# Patient Record
Sex: Female | Born: 1970 | Race: Asian | Hispanic: No | Marital: Single | Smoking: Former smoker
Health system: Southern US, Community
[De-identification: ages and names within clinical notes are randomized; demographics above are authoritative.]

## PROBLEM LIST (undated history)

## (undated) DIAGNOSIS — J302 Other seasonal allergic rhinitis: Secondary | ICD-10-CM

## (undated) DIAGNOSIS — J039 Acute tonsillitis, unspecified: Secondary | ICD-10-CM

## (undated) DIAGNOSIS — R591 Generalized enlarged lymph nodes: Secondary | ICD-10-CM

## (undated) HISTORY — DX: Acute tonsillitis, unspecified: J03.90

## (undated) HISTORY — DX: Generalized enlarged lymph nodes: R59.1

## (undated) HISTORY — DX: Other seasonal allergic rhinitis: J30.2

## (undated) HISTORY — PX: OTHER SURGICAL HISTORY: SHX169

---

## 2005-04-05 ENCOUNTER — Other Ambulatory Visit: Admission: RE | Admit: 2005-04-05 | Discharge: 2005-04-05 | Payer: Self-pay | Admitting: Family Medicine

## 2005-04-20 ENCOUNTER — Encounter: Admission: RE | Admit: 2005-04-20 | Discharge: 2005-04-20 | Payer: Self-pay | Admitting: Family Medicine

## 2006-07-18 ENCOUNTER — Other Ambulatory Visit: Admission: RE | Admit: 2006-07-18 | Discharge: 2006-07-18 | Payer: Self-pay | Admitting: Family Medicine

## 2009-11-25 ENCOUNTER — Other Ambulatory Visit: Admission: RE | Admit: 2009-11-25 | Discharge: 2009-11-25 | Payer: Self-pay | Admitting: Family Medicine

## 2010-06-03 ENCOUNTER — Emergency Department (INDEPENDENT_AMBULATORY_CARE_PROVIDER_SITE_OTHER): Payer: BC Managed Care – PPO

## 2010-06-03 ENCOUNTER — Emergency Department (HOSPITAL_BASED_OUTPATIENT_CLINIC_OR_DEPARTMENT_OTHER)
Admission: EM | Admit: 2010-06-03 | Discharge: 2010-06-03 | Disposition: A | Discharge status: Home / Self Care | Payer: BC Managed Care – PPO | Attending: Emergency Medicine | Admitting: Emergency Medicine

## 2010-06-03 DIAGNOSIS — S61409A Unspecified open wound of unspecified hand, initial encounter: Secondary | ICD-10-CM | POA: Insufficient documentation

## 2010-06-03 DIAGNOSIS — W268XXA Contact with other sharp object(s), not elsewhere classified, initial encounter: Secondary | ICD-10-CM

## 2010-06-03 DIAGNOSIS — Y92009 Unspecified place in unspecified non-institutional (private) residence as the place of occurrence of the external cause: Secondary | ICD-10-CM | POA: Insufficient documentation

## 2010-06-03 DIAGNOSIS — Z0389 Encounter for observation for other suspected diseases and conditions ruled out: Secondary | ICD-10-CM

## 2010-09-01 ENCOUNTER — Other Ambulatory Visit: Payer: Self-pay | Admitting: Family Medicine

## 2010-09-01 DIAGNOSIS — Z1231 Encounter for screening mammogram for malignant neoplasm of breast: Secondary | ICD-10-CM

## 2010-09-06 ENCOUNTER — Ambulatory Visit
Admission: RE | Admit: 2010-09-06 | Discharge: 2010-09-06 | Disposition: A | Discharge status: Home / Self Care | Payer: BC Managed Care – PPO | Source: Ambulatory Visit | Attending: Family Medicine | Admitting: Family Medicine

## 2010-09-06 DIAGNOSIS — Z1231 Encounter for screening mammogram for malignant neoplasm of breast: Secondary | ICD-10-CM

## 2010-11-10 ENCOUNTER — Other Ambulatory Visit: Payer: Self-pay | Admitting: Family Medicine

## 2010-11-10 DIAGNOSIS — R591 Generalized enlarged lymph nodes: Secondary | ICD-10-CM

## 2010-11-14 ENCOUNTER — Other Ambulatory Visit: Payer: BC Managed Care – PPO

## 2011-03-15 ENCOUNTER — Ambulatory Visit
Admission: RE | Admit: 2011-03-15 | Discharge: 2011-03-15 | Disposition: A | Discharge status: Home / Self Care | Payer: BC Managed Care – PPO | Source: Ambulatory Visit | Attending: Family Medicine | Admitting: Family Medicine

## 2011-03-15 DIAGNOSIS — R591 Generalized enlarged lymph nodes: Secondary | ICD-10-CM

## 2011-03-15 MED ORDER — IOHEXOL 300 MG/ML  SOLN
75.0000 mL | Freq: Once | INTRAMUSCULAR | Status: AC | PRN
  Administered 2011-03-15: 75 mL via INTRAVENOUS

## 2011-05-09 ENCOUNTER — Other Ambulatory Visit (HOSPITAL_COMMUNITY)
Admission: RE | Admit: 2011-05-09 | Discharge: 2011-05-09 | Disposition: A | Discharge status: Home / Self Care | Payer: BC Managed Care – PPO | Source: Ambulatory Visit | Attending: Otolaryngology | Admitting: Otolaryngology

## 2011-05-09 ENCOUNTER — Other Ambulatory Visit: Payer: Self-pay | Admitting: Otolaryngology

## 2011-05-09 DIAGNOSIS — R220 Localized swelling, mass and lump, head: Secondary | ICD-10-CM | POA: Insufficient documentation

## 2011-05-09 DIAGNOSIS — R221 Localized swelling, mass and lump, neck: Secondary | ICD-10-CM | POA: Insufficient documentation

## 2011-06-29 DIAGNOSIS — R591 Generalized enlarged lymph nodes: Secondary | ICD-10-CM | POA: Insufficient documentation

## 2011-06-29 DIAGNOSIS — J039 Acute tonsillitis, unspecified: Secondary | ICD-10-CM

## 2011-07-05 ENCOUNTER — Ambulatory Visit
Admission: RE | Admit: 2011-07-05 | Discharge: 2011-07-05 | Disposition: A | Discharge status: Home / Self Care | Payer: BC Managed Care – PPO | Source: Ambulatory Visit | Attending: Internal Medicine | Admitting: Internal Medicine

## 2011-07-05 ENCOUNTER — Ambulatory Visit (INDEPENDENT_AMBULATORY_CARE_PROVIDER_SITE_OTHER): Payer: BC Managed Care – PPO | Admitting: Internal Medicine

## 2011-07-05 ENCOUNTER — Encounter: Payer: Self-pay | Admitting: Internal Medicine

## 2011-07-05 VITALS — BP 132/84 | HR 67 | Temp 98.1°F | Ht 61.0 in | Wt 149.8 lb

## 2011-07-05 DIAGNOSIS — R590 Localized enlarged lymph nodes: Secondary | ICD-10-CM

## 2011-07-05 DIAGNOSIS — R599 Enlarged lymph nodes, unspecified: Secondary | ICD-10-CM

## 2011-07-05 DIAGNOSIS — R591 Generalized enlarged lymph nodes: Secondary | ICD-10-CM

## 2011-07-05 LAB — POCT URINE PREGNANCY: Preg Test, Ur: NEGATIVE

## 2011-07-05 NOTE — Assessment & Plan Note (Addendum)
I did not detect any other lymphadenopathy. I will also check a chest x-ray to assure no other perihilar lymphadenopathy for improved screening for sarcoid. I agree though with all the other blood tests done by Dr. Jearld Fenton. I did discuss with the patient the options and with persistent adenopathy since at least 2009 that I agree with open biopsy and that noninvasive testing is really generally unrevealing as is the case with her. The patient voiced her understanding. She is going to followup with Dr. Jearld Fenton to discuss the option of biopsy.  I would recommend from an infectious disease standpoint the biopsy include a sample in saline and sent for AFB smear and culture, fungal culture and bacterial Gram stain and culture in addition to the usual flow cytometry and pathology. If the biopsy is consistent with infection, she will return but otherwise will return on a p.r.n. Basis.

## 2011-07-05 NOTE — Progress Notes (Signed)
  Subjective:    Patient ID: Kathryn Greene, female    DOB: 1970/10/31, 41 y.o.   MRN: [id number]  HPI This patient comes in for evaluation of lymphadenopathy. She first noticed lymphadenopathy about one year ago and was noted to be in her neck region. She noticed an enlargement of her neck and what seemed to be a "fat neck". She has been evaluated by ENT who also noted the submandibular lymphadenopathy that is greater than 1 cm. No other associated lymphadenopathy. She did have evaluation by blood for an ACE level which was in the normal range, Sjogren's syndrome, HIV, ANA and a fine needle aspirate which all were unrevealing. She denies any weight loss, no night sweats, no diarrhea, some mild rash in the area of her tattoo and some itching around her neck side of lymphadenopathy. Her lymphadenopathy was evaluated by CAT scan which also compared to size to an MRI in 2009 which was done for an unrelated reason  And has been essentially unchanged. She is here to get a second opinion regarding the noninvasive workup.   Review of Systems  Constitutional: Negative.   HENT: Negative for hearing loss, ear pain, congestion, sore throat, facial swelling, rhinorrhea, mouth sores, trouble swallowing, neck pain, neck stiffness and tinnitus.   Eyes: Negative.   Respiratory: Negative.   Cardiovascular: Negative for chest pain, palpitations and leg swelling.  Gastrointestinal: Negative for nausea, vomiting, abdominal pain, diarrhea, constipation, blood in stool and abdominal distention.  Musculoskeletal: Negative.   Skin:       Some itching on her neck and in the area of her tattoo  Neurological: Negative.   Hematological: Positive for adenopathy.  Psychiatric/Behavioral: Negative.        Objective:   Physical Exam  Constitutional: She is oriented to person, place, and time. She appears well-developed and well-nourished. No distress.  HENT:       Lymphadenopathy prominent in the submandibular area bilateral  that are possibly 1 cm in diameter with another smaller lymph node on the left  Cardiovascular: Normal rate, regular rhythm and normal heart sounds.  Exam reveals no gallop and no friction rub.   No murmur heard. Pulmonary/Chest: Effort normal and breath sounds normal. No respiratory distress. She has no wheezes. She has no rales.  Abdominal: Soft. Bowel sounds are normal. She exhibits no distension. There is no tenderness. There is no rebound.  Musculoskeletal: Normal range of motion.  Neurological: She is alert and oriented to person, place, and time.  Skin: Skin is warm and dry. No rash noted. No erythema. No pallor.          Assessment & Plan:

## 2013-08-27 ENCOUNTER — Other Ambulatory Visit (HOSPITAL_COMMUNITY)
Admission: RE | Admit: 2013-08-27 | Discharge: 2013-08-27 | Disposition: A | Discharge status: Home / Self Care | Payer: BC Managed Care – PPO | Source: Ambulatory Visit | Attending: Family Medicine | Admitting: Family Medicine

## 2013-08-27 ENCOUNTER — Other Ambulatory Visit: Payer: Self-pay | Admitting: Family Medicine

## 2013-08-27 DIAGNOSIS — Z1151 Encounter for screening for human papillomavirus (HPV): Secondary | ICD-10-CM | POA: Insufficient documentation

## 2013-08-27 DIAGNOSIS — Z124 Encounter for screening for malignant neoplasm of cervix: Secondary | ICD-10-CM | POA: Insufficient documentation

## 2013-08-28 LAB — CYTOLOGY - PAP

## 2013-09-15 ENCOUNTER — Encounter (HOSPITAL_BASED_OUTPATIENT_CLINIC_OR_DEPARTMENT_OTHER): Payer: Self-pay | Admitting: Emergency Medicine

## 2013-09-15 ENCOUNTER — Inpatient Hospital Stay (HOSPITAL_BASED_OUTPATIENT_CLINIC_OR_DEPARTMENT_OTHER)
Admission: EM | Admit: 2013-09-15 | Discharge: 2013-09-17 | DRG: 482 | Disposition: A | Discharge status: Home / Self Care | Payer: BC Managed Care – PPO | Attending: Orthopedic Surgery | Admitting: Orthopedic Surgery

## 2013-09-15 ENCOUNTER — Emergency Department (HOSPITAL_BASED_OUTPATIENT_CLINIC_OR_DEPARTMENT_OTHER): Payer: BC Managed Care – PPO

## 2013-09-15 DIAGNOSIS — Z885 Allergy status to narcotic agent status: Secondary | ICD-10-CM

## 2013-09-15 DIAGNOSIS — S72309A Unspecified fracture of shaft of unspecified femur, initial encounter for closed fracture: Principal | ICD-10-CM | POA: Diagnosis present

## 2013-09-15 DIAGNOSIS — Z87891 Personal history of nicotine dependence: Secondary | ICD-10-CM | POA: Diagnosis not present

## 2013-09-15 DIAGNOSIS — Z23 Encounter for immunization: Secondary | ICD-10-CM

## 2013-09-15 DIAGNOSIS — W19XXXA Unspecified fall, initial encounter: Secondary | ICD-10-CM

## 2013-09-15 DIAGNOSIS — Y93K1 Activity, walking an animal: Secondary | ICD-10-CM | POA: Diagnosis not present

## 2013-09-15 DIAGNOSIS — M79609 Pain in unspecified limb: Secondary | ICD-10-CM | POA: Diagnosis present

## 2013-09-15 DIAGNOSIS — Z881 Allergy status to other antibiotic agents status: Secondary | ICD-10-CM

## 2013-09-15 DIAGNOSIS — W010XXA Fall on same level from slipping, tripping and stumbling without subsequent striking against object, initial encounter: Secondary | ICD-10-CM | POA: Diagnosis present

## 2013-09-15 DIAGNOSIS — S7291XA Unspecified fracture of right femur, initial encounter for closed fracture: Secondary | ICD-10-CM

## 2013-09-15 LAB — BASIC METABOLIC PANEL
ANION GAP: 16 — AB (ref 5–15)
BUN: 10 mg/dL (ref 6–23)
CALCIUM: 9.1 mg/dL (ref 8.4–10.5)
CO2: 23 meq/L (ref 19–32)
Chloride: 101 mEq/L (ref 96–112)
Creatinine, Ser: 0.9 mg/dL (ref 0.50–1.10)
GFR calc Af Amer: 89 mL/min — ABNORMAL LOW (ref >90)
GFR, EST NON AFRICAN AMERICAN: 77 mL/min — AB (ref >90)
GLUCOSE: 202 mg/dL — AB (ref 70–99)
Potassium: 3.3 mEq/L — ABNORMAL LOW (ref 3.7–5.3)
Sodium: 140 mEq/L (ref 137–147)

## 2013-09-15 LAB — CBC
HCT: 35.6 % — ABNORMAL LOW (ref 36.0–46.0)
Hemoglobin: 12 g/dL (ref 12.0–15.0)
MCH: 28.2 pg (ref 26.0–34.0)
MCHC: 33.7 g/dL (ref 30.0–36.0)
MCV: 83.8 fL (ref 78.0–100.0)
PLATELETS: 315 10*3/uL (ref 150–400)
RBC: 4.25 MIL/uL (ref 3.87–5.11)
RDW: 13 % (ref 11.5–15.5)
WBC: 10.4 10*3/uL (ref 4.0–10.5)

## 2013-09-15 MED ORDER — ONDANSETRON HCL 4 MG/2ML IJ SOLN
4.0000 mg | Freq: Once | INTRAMUSCULAR | Status: AC
  Administered 2013-09-15: 4 mg via INTRAVENOUS
  Filled 2013-09-15: qty 2

## 2013-09-15 MED ORDER — TETANUS-DIPHTH-ACELL PERTUSSIS 5-2.5-18.5 LF-MCG/0.5 IM SUSP
0.5000 mL | Freq: Once | INTRAMUSCULAR | Status: AC
  Administered 2013-09-15: 0.5 mL via INTRAMUSCULAR
  Filled 2013-09-15: qty 0.5

## 2013-09-15 MED ORDER — MORPHINE SULFATE 4 MG/ML IJ SOLN
4.0000 mg | Freq: Once | INTRAMUSCULAR | Status: AC
  Administered 2013-09-15: 4 mg via INTRAVENOUS
  Filled 2013-09-15: qty 1

## 2013-09-15 MED ORDER — MORPHINE SULFATE 4 MG/ML IJ SOLN
4.0000 mg | Freq: Once | INTRAMUSCULAR | Status: AC
  Administered 2013-09-15: 4 mg via INTRAVENOUS

## 2013-09-15 MED ORDER — MORPHINE SULFATE 4 MG/ML IJ SOLN
INTRAMUSCULAR | Status: AC
  Administered 2013-09-15: 4 mg via INTRAVENOUS
  Filled 2013-09-15: qty 1

## 2013-09-15 NOTE — ED Notes (Signed)
Pt states she was walking her dog when another dog attacked her dog, she tried to fight it off and tripped and fell.  Pt has abrasion over right eye and on right hand.  Pt c/o right leg pain from hip to knee.  Pt denies any LOC.

## 2013-09-15 NOTE — ED Provider Notes (Signed)
CSN: [id number]     Arrival date & time 09/15/13  2125 History   First MD Initiated Contact with Patient 09/15/13 2132     This chart was scribed for Ethelda ChickMartha K Linker, MD by Arlan OrganAshley Leger, ED Scribe. This patient was seen in room MH04/MH04 and the patient's care was started 10:10 PM.   Chief Complaint  Patient presents with  . Fall   The history is provided by the patient. No language interpreter was used.    HPI Comments: Arlis PortaFrances Mijangos brought in by ambulance is a 43 y.o. female who presents to the Emergency Department complaining of a fall that occurred just prior to arrival. Pt states she was walking her dog when another dog attacked her dog. States she attempted to fight the other dog off resulting her tripping and falling. She admits to hitting her head, however, no LOC. She now c/o constant, moderate R lower extremity pain that is progressively worsening. Pain is exacerbated with movement and palpation. No alleviating factors at this time. She has not tried any OTC medications or any home remedies to help manage symptoms. No fever noted. Pt with known allergies to Codeine and Bactrim. She has no pertinent past medical history. No other concerns this visit.   Past Medical History  Diagnosis Date  . Lymphadenopathy   . Tonsillitis   . Seasonal allergies    Past Surgical History  Procedure Laterality Date  . Fine needle aspirate     History reviewed. No pertinent family history. History  Substance Use Topics  . Smoking status: Former Games developermoker  . Smokeless tobacco: Never Used  . Alcohol Use: Yes     Comment: occasionally   OB History   Grav Para Term Preterm Abortions TAB SAB Ect Mult Living                 Review of Systems  Constitutional: Negative for fever.  Musculoskeletal: Positive for arthralgias (L lower extremity).  All other systems reviewed and are negative.     Allergies  Codeine and Bactrim  Home Medications   Prior to Admission medications   Medication Sig  Start Date End Date Taking? Authorizing Provider  cetirizine (ZYRTEC) 10 MG tablet Take 10 mg by mouth daily as needed for allergies.    Yes Historical Provider, MD  Vitamin D, Ergocalciferol, (DRISDOL) 50000 UNITS CAPS capsule Take 50,000 Units by mouth 2 (two) times a week. Monday and thursday   Yes Historical Provider, MD   Triage Vitals: BP 129/70  Pulse 76  Temp(Src) 98.3 F (36.8 C) (Oral)  Resp 18  Ht 5\' 2"  (1.575 m)  Wt 150 lb (68.04 kg)  BMI 27.43 kg/m2  SpO2 100%  LMP 08/25/2013   Physical Exam  Nursing note and vitals reviewed. Constitutional: She is oriented to person, place, and time. She appears well-developed and well-nourished.  HENT:  Head: Normocephalic.  Eyes: EOM are normal.  Neck: Normal range of motion.  Pulmonary/Chest: Effort normal.  Abdominal: She exhibits no distension.  Musculoskeletal: Normal range of motion.  Neurological: She is alert and oriented to person, place, and time.  Psychiatric: She has a normal mood and affect.  note- uncomfortable appearing, ttp over medial right thigh, not able to tolerate manipulation to exam hip well, distally NVI, 2+ dorsalis pedis pulses, lungs- CTAB, no wheezing no rales or rhonchi, neck- no midline tenderness to palpation  ED Course  Procedures (including critical care time)  DIAGNOSTIC STUDIES: Oxygen Saturation is 100% on RA, Normal by  my interpretation.    COORDINATION OF CARE: 10:12 PM- Will order CBC, BMP, DG chest 2 view, DG knee complete 4 view R, DG chest 1 view, DG chest 2 view, DG femur R, and DG knee complete 4 view R. Will give morphine, zofran, and Tdap booster. Discussed treatment plan with pt at bedside and pt agreed to plan.     Labs Review Labs Reviewed  SURGICAL PCR SCREEN - Abnormal; Notable for the following:    Staphylococcus aureus POSITIVE (*)    All other components within normal limits  CBC - Abnormal; Notable for the following:    HCT 35.6 (*)    All other components within normal  limits  BASIC METABOLIC PANEL - Abnormal; Notable for the following:    Potassium 3.3 (*)    Glucose, Bld 202 (*)    GFR calc non Af Amer 77 (*)    GFR calc Af Amer 89 (*)    Anion gap 16 (*)    All other components within normal limits  URINALYSIS, ROUTINE W REFLEX MICROSCOPIC - Abnormal; Notable for the following:    Glucose, UA 100 (*)    All other components within normal limits  BASIC METABOLIC PANEL - Abnormal; Notable for the following:    CO2 18 (*)    Glucose, Bld 143 (*)    Calcium 8.1 (*)    Anion gap 17 (*)    All other components within normal limits  URINE CULTURE  CBC    Imaging Review Dg Chest 1 View  09/15/2013   CLINICAL DATA:  Femur fracture  EXAM: CHEST - 1 VIEW  COMPARISON:  07/11/2012  FINDINGS: The heart size and mediastinal contours are within normal limits. Both lungs are clear. The visualized skeletal structures are unremarkable.  IMPRESSION: No active disease.   Electronically Signed   By: Marlan Palau M.D.   On: 09/15/2013 22:57   Dg Femur Right  09/16/2013   CLINICAL DATA:  Intramedullary nail placement for fracture  EXAM: DG C-ARM 61-120 MIN; RIGHT FEMUR - 2 VIEW  COMPARISON:  September 15, 2013  FINDINGS: Frontal views of the right femur were obtained. There is screw and nail fixation through a fracture of the junction of the proximal and mid thirds of the right femur. Alignment is near anatomic at the fracture site. There appears to be a piece of bone that has sheared off medially just distal to the fracture site with this fragment obliquely oriented with respect to the rod in the mid femur region. No dislocation.  IMPRESSION: There appears to be a sheared off segment of bone from the medial mid femur medial to the fixation nail with this fragment obliquely oriented overlying the rod in the mid femur region. At the fracture site itself, alignment is near anatomic.   Electronically Signed   By: Bretta Bang M.D.   On: 09/16/2013 15:55   Dg Femur  Right  09/15/2013   CLINICAL DATA:  Fall.  Limited imaging requested.  EXAM: RIGHT FEMUR - 1 VIEW  COMPARISON:  None.  FINDINGS: There is a comminuted, predominantly transverse fracture through the proximal femoral diaphysis with 100% posterior and medial displacement. The hip and knee is located in the frontal projection.  IMPRESSION: Displaced right femur proximal diaphysis fracture, as above.   Electronically Signed   By: Tiburcio Pea M.D.   On: 09/15/2013 22:56   Dg C-arm 1-60 Min  09/16/2013   CLINICAL DATA:  Intramedullary nail placement for fracture  EXAM: DG C-ARM 61-120 MIN; RIGHT FEMUR - 2 VIEW  COMPARISON:  September 15, 2013  FINDINGS: Frontal views of the right femur were obtained. There is screw and nail fixation through a fracture of the junction of the proximal and mid thirds of the right femur. Alignment is near anatomic at the fracture site. There appears to be a piece of bone that has sheared off medially just distal to the fracture site with this fragment obliquely oriented with respect to the rod in the mid femur region. No dislocation.  IMPRESSION: There appears to be a sheared off segment of bone from the medial mid femur medial to the fixation nail with this fragment obliquely oriented overlying the rod in the mid femur region. At the fracture site itself, alignment is near anatomic.   Electronically Signed   By: Bretta Bang M.D.   On: 09/16/2013 15:55     EKG Interpretation None      MDM   Final diagnoses:  Femur fracture, right, closed, initial encounter  Fall, initial encounter    Pt presenting with pain in right thigh after fall this evening.  Xray shows transverse mid shaft femur fracture.   Xray images reviewed and interpreted by me as well.  Pt treated with IV pain meds, labs obtained.  D/w ortho, Dr. Sherlean Foot who requests transfer to [facility].  Pt to be NPO after midnight   I personally performed the services described in this documentation, which was scribed in my  presence. The recorded information has been reviewed and is accurate.    Ethelda Chick, MD 09/17/13 0130

## 2013-09-15 NOTE — ED Notes (Signed)
MD at bedside. 

## 2013-09-15 NOTE — ED Notes (Signed)
Patient transported to X-ray 

## 2013-09-16 ENCOUNTER — Inpatient Hospital Stay (HOSPITAL_COMMUNITY): Payer: BC Managed Care – PPO | Admitting: Certified Registered Nurse Anesthetist

## 2013-09-16 ENCOUNTER — Encounter (HOSPITAL_COMMUNITY)
Admission: EM | Disposition: A | Discharge status: Home / Self Care | Payer: Self-pay | Source: Home / Self Care | Attending: Orthopedic Surgery

## 2013-09-16 ENCOUNTER — Encounter (HOSPITAL_COMMUNITY): Payer: Self-pay | Admitting: Certified Registered Nurse Anesthetist

## 2013-09-16 ENCOUNTER — Inpatient Hospital Stay (HOSPITAL_COMMUNITY): Payer: BC Managed Care – PPO

## 2013-09-16 ENCOUNTER — Encounter (HOSPITAL_COMMUNITY): Payer: BC Managed Care – PPO | Admitting: Certified Registered Nurse Anesthetist

## 2013-09-16 DIAGNOSIS — S72309A Unspecified fracture of shaft of unspecified femur, initial encounter for closed fracture: Secondary | ICD-10-CM | POA: Diagnosis not present

## 2013-09-16 DIAGNOSIS — M79609 Pain in unspecified limb: Secondary | ICD-10-CM | POA: Diagnosis not present

## 2013-09-16 HISTORY — PX: FEMUR IM NAIL: SHX1597

## 2013-09-16 LAB — CBC
HCT: 36.9 % (ref 36.0–46.0)
Hemoglobin: 12.2 g/dL (ref 12.0–15.0)
MCH: 28.4 pg (ref 26.0–34.0)
MCHC: 33.1 g/dL (ref 30.0–36.0)
MCV: 85.8 fL (ref 78.0–100.0)
Platelets: 291 10*3/uL (ref 150–400)
RBC: 4.3 MIL/uL (ref 3.87–5.11)
RDW: 13.6 % (ref 11.5–15.5)
WBC: 9.1 10*3/uL (ref 4.0–10.5)

## 2013-09-16 LAB — URINALYSIS, ROUTINE W REFLEX MICROSCOPIC
BILIRUBIN URINE: NEGATIVE
Glucose, UA: 100 mg/dL — AB
Hgb urine dipstick: NEGATIVE
Ketones, ur: NEGATIVE mg/dL
LEUKOCYTES UA: NEGATIVE
NITRITE: NEGATIVE
PH: 7 (ref 5.0–8.0)
Protein, ur: NEGATIVE mg/dL
SPECIFIC GRAVITY, URINE: 1.017 (ref 1.005–1.030)
Urobilinogen, UA: 0.2 mg/dL (ref 0.0–1.0)

## 2013-09-16 LAB — BASIC METABOLIC PANEL
Anion gap: 17 — ABNORMAL HIGH (ref 5–15)
BUN: 8 mg/dL (ref 6–23)
CALCIUM: 8.1 mg/dL — AB (ref 8.4–10.5)
CO2: 18 mEq/L — ABNORMAL LOW (ref 19–32)
CREATININE: 0.61 mg/dL (ref 0.50–1.10)
Chloride: 107 mEq/L (ref 96–112)
GFR calc Af Amer: >90 mL/min (ref >90)
GFR calc non Af Amer: >90 mL/min (ref >90)
GLUCOSE: 143 mg/dL — AB (ref 70–99)
Potassium: 4.1 mEq/L (ref 3.7–5.3)
Sodium: 142 mEq/L (ref 137–147)

## 2013-09-16 LAB — SURGICAL PCR SCREEN
MRSA, PCR: NEGATIVE
Staphylococcus aureus: POSITIVE — AB

## 2013-09-16 SURGERY — INSERTION, INTRAMEDULLARY ROD, FEMUR
Anesthesia: General | Laterality: Right

## 2013-09-16 MED ORDER — CEFAZOLIN SODIUM-DEXTROSE 2-3 GM-% IV SOLR
2.0000 g | Freq: Once | INTRAVENOUS | Status: AC
  Administered 2013-09-16: 2 g via INTRAVENOUS

## 2013-09-16 MED ORDER — ONDANSETRON HCL 4 MG/2ML IJ SOLN
INTRAMUSCULAR | Status: AC
  Filled 2013-09-16: qty 2

## 2013-09-16 MED ORDER — FENTANYL CITRATE 0.05 MG/ML IJ SOLN
INTRAMUSCULAR | Status: AC
  Administered 2013-09-16: 50 ug via INTRAVENOUS
  Filled 2013-09-16: qty 2

## 2013-09-16 MED ORDER — NEOSTIGMINE METHYLSULFATE 10 MG/10ML IV SOLN
INTRAVENOUS | Status: DC | PRN
  Administered 2013-09-16: 4 mg via INTRAVENOUS

## 2013-09-16 MED ORDER — LIDOCAINE HCL (CARDIAC) 20 MG/ML IV SOLN
INTRAVENOUS | Status: DC | PRN
  Administered 2013-09-16: 70 mg via INTRAVENOUS

## 2013-09-16 MED ORDER — EPHEDRINE SULFATE 50 MG/ML IJ SOLN
INTRAMUSCULAR | Status: AC
  Filled 2013-09-16: qty 1

## 2013-09-16 MED ORDER — METHOCARBAMOL 500 MG PO TABS: 500.0000 mg | ORAL_TABLET | Freq: Four times a day (QID) | ORAL | Status: DC | PRN

## 2013-09-16 MED ORDER — HYDROMORPHONE HCL PF 1 MG/ML IJ SOLN
0.5000 mg | INTRAMUSCULAR | Status: DC | PRN
  Administered 2013-09-16: 1 mg via INTRAVENOUS
  Filled 2013-09-16: qty 1

## 2013-09-16 MED ORDER — LACTATED RINGERS IV SOLN
INTRAVENOUS | Status: DC
  Administered 2013-09-16: 11:00:00 via INTRAVENOUS

## 2013-09-16 MED ORDER — LIDOCAINE HCL (CARDIAC) 20 MG/ML IV SOLN
INTRAVENOUS | Status: AC
  Filled 2013-09-16: qty 5

## 2013-09-16 MED ORDER — MIDAZOLAM HCL 2 MG/2ML IJ SOLN
INTRAMUSCULAR | Status: AC
  Filled 2013-09-16: qty 2

## 2013-09-16 MED ORDER — MORPHINE SULFATE 2 MG/ML IJ SOLN
0.5000 mg | INTRAMUSCULAR | Status: DC | PRN
  Administered 2013-09-16: 0.5 mg via INTRAVENOUS
  Filled 2013-09-16: qty 1

## 2013-09-16 MED ORDER — SODIUM CHLORIDE 0.9 % IV SOLN
INTRAVENOUS | Status: DC
  Administered 2013-09-16: 17:00:00 via INTRAVENOUS

## 2013-09-16 MED ORDER — ROCURONIUM BROMIDE 50 MG/5ML IV SOLN
INTRAVENOUS | Status: AC
  Filled 2013-09-16: qty 1

## 2013-09-16 MED ORDER — HYDROCODONE-ACETAMINOPHEN 5-325 MG PO TABS: 1.0000 | ORAL_TABLET | Freq: Four times a day (QID) | ORAL | Status: DC | PRN

## 2013-09-16 MED ORDER — LACTATED RINGERS IV SOLN
INTRAVENOUS | Status: DC | PRN
  Administered 2013-09-16 (×2): via INTRAVENOUS

## 2013-09-16 MED ORDER — METHOCARBAMOL 1000 MG/10ML IJ SOLN
500.0000 mg | Freq: Four times a day (QID) | INTRAMUSCULAR | Status: DC | PRN
  Administered 2013-09-16: 500 mg via INTRAVENOUS
  Filled 2013-09-16: qty 5

## 2013-09-16 MED ORDER — METOCLOPRAMIDE HCL 5 MG/ML IJ SOLN: 5.0000 mg | Freq: Three times a day (TID) | INTRAMUSCULAR | Status: DC | PRN

## 2013-09-16 MED ORDER — SODIUM CHLORIDE 0.9 % IJ SOLN
INTRAMUSCULAR | Status: AC
  Filled 2013-09-16: qty 10

## 2013-09-16 MED ORDER — METOCLOPRAMIDE HCL 5 MG PO TABS: 5.0000 mg | ORAL_TABLET | Freq: Three times a day (TID) | ORAL | Status: DC | PRN

## 2013-09-16 MED ORDER — CEFAZOLIN SODIUM-DEXTROSE 2-3 GM-% IV SOLR
2.0000 g | INTRAVENOUS | Status: DC
  Filled 2013-09-16: qty 50

## 2013-09-16 MED ORDER — PROPOFOL 10 MG/ML IV BOLUS
INTRAVENOUS | Status: AC
  Filled 2013-09-16: qty 20

## 2013-09-16 MED ORDER — CEFAZOLIN SODIUM-DEXTROSE 2-3 GM-% IV SOLR
INTRAVENOUS | Status: AC
  Filled 2013-09-16: qty 50

## 2013-09-16 MED ORDER — PHENYLEPHRINE 40 MCG/ML (10ML) SYRINGE FOR IV PUSH (FOR BLOOD PRESSURE SUPPORT)
PREFILLED_SYRINGE | INTRAVENOUS | Status: AC
  Filled 2013-09-16: qty 10

## 2013-09-16 MED ORDER — ONDANSETRON HCL 4 MG PO TABS: 4.0000 mg | ORAL_TABLET | Freq: Four times a day (QID) | ORAL | Status: DC | PRN

## 2013-09-16 MED ORDER — ONDANSETRON HCL 4 MG/2ML IJ SOLN
INTRAMUSCULAR | Status: DC | PRN
Start: 2013-09-16 — End: 2013-09-16
  Administered 2013-09-16: 4 mg via INTRAVENOUS

## 2013-09-16 MED ORDER — ONDANSETRON HCL 4 MG/2ML IJ SOLN
4.0000 mg | Freq: Four times a day (QID) | INTRAMUSCULAR | Status: DC | PRN
  Administered 2013-09-16: 4 mg via INTRAVENOUS
  Filled 2013-09-16: qty 2

## 2013-09-16 MED ORDER — MORPHINE SULFATE 4 MG/ML IJ SOLN
4.0000 mg | Freq: Once | INTRAMUSCULAR | Status: AC
  Administered 2013-09-16: 4 mg via INTRAVENOUS
  Filled 2013-09-16: qty 1

## 2013-09-16 MED ORDER — ASPIRIN EC 325 MG PO TBEC
325.0000 mg | DELAYED_RELEASE_TABLET | Freq: Every day | ORAL | Status: DC
  Administered 2013-09-16 – 2013-09-17 (×2): 325 mg via ORAL
  Filled 2013-09-16 (×2): qty 1

## 2013-09-16 MED ORDER — MIDAZOLAM HCL 5 MG/5ML IJ SOLN
INTRAMUSCULAR | Status: DC | PRN
  Administered 2013-09-16: 2 mg via INTRAVENOUS

## 2013-09-16 MED ORDER — FENTANYL CITRATE 0.05 MG/ML IJ SOLN
INTRAMUSCULAR | Status: AC
  Filled 2013-09-16: qty 5

## 2013-09-16 MED ORDER — SODIUM CHLORIDE 0.9 % IV SOLN
INTRAVENOUS | Status: DC
  Administered 2013-09-16: 05:00:00 via INTRAVENOUS

## 2013-09-16 MED ORDER — GLYCOPYRROLATE 0.2 MG/ML IJ SOLN
INTRAMUSCULAR | Status: DC | PRN
  Administered 2013-09-16: 0.6 mg via INTRAVENOUS

## 2013-09-16 MED ORDER — FENTANYL CITRATE 0.05 MG/ML IJ SOLN
INTRAMUSCULAR | Status: AC
  Administered 2013-09-16: 25 ug via INTRAVENOUS
  Filled 2013-09-16: qty 2

## 2013-09-16 MED ORDER — SUCCINYLCHOLINE CHLORIDE 20 MG/ML IJ SOLN
INTRAMUSCULAR | Status: AC
  Filled 2013-09-16: qty 1

## 2013-09-16 MED ORDER — ROCURONIUM BROMIDE 100 MG/10ML IV SOLN
INTRAVENOUS | Status: DC | PRN
  Administered 2013-09-16: 40 mg via INTRAVENOUS

## 2013-09-16 MED ORDER — METHOCARBAMOL 1000 MG/10ML IJ SOLN
500.0000 mg | Freq: Four times a day (QID) | INTRAVENOUS | Status: DC | PRN
  Filled 2013-09-16 (×2): qty 5

## 2013-09-16 MED ORDER — BACITRACIN ZINC 500 UNIT/GM EX OINT
TOPICAL_OINTMENT | Freq: Two times a day (BID) | CUTANEOUS | Status: DC
  Administered 2013-09-16 – 2013-09-17 (×2): via TOPICAL
  Filled 2013-09-16: qty 28.35
  Filled 2013-09-16 (×2): qty 15

## 2013-09-16 MED ORDER — PROPOFOL 10 MG/ML IV BOLUS
INTRAVENOUS | Status: DC | PRN
  Administered 2013-09-16: 160 mg via INTRAVENOUS

## 2013-09-16 MED ORDER — ONDANSETRON HCL 4 MG/2ML IJ SOLN
4.0000 mg | Freq: Four times a day (QID) | INTRAMUSCULAR | Status: DC | PRN
  Administered 2013-09-16: 4 mg via INTRAVENOUS

## 2013-09-16 MED ORDER — FENTANYL CITRATE 0.05 MG/ML IJ SOLN
25.0000 ug | INTRAMUSCULAR | Status: DC | PRN
  Administered 2013-09-16 (×2): 50 ug via INTRAVENOUS
  Administered 2013-09-16: 25 ug via INTRAVENOUS

## 2013-09-16 MED ORDER — DOCUSATE SODIUM 100 MG PO CAPS
100.0000 mg | ORAL_CAPSULE | Freq: Two times a day (BID) | ORAL | Status: DC
  Administered 2013-09-16 – 2013-09-17 (×2): 100 mg via ORAL
  Filled 2013-09-16 (×2): qty 1

## 2013-09-16 MED ORDER — DEXAMETHASONE SODIUM PHOSPHATE 4 MG/ML IJ SOLN
INTRAMUSCULAR | Status: DC | PRN
  Administered 2013-09-16: 4 mg via INTRAVENOUS

## 2013-09-16 MED ORDER — OXYCODONE-ACETAMINOPHEN 5-325 MG PO TABS
1.0000 | ORAL_TABLET | ORAL | Status: DC | PRN
  Administered 2013-09-16 – 2013-09-17 (×2): 2 via ORAL
  Filled 2013-09-16 (×2): qty 2

## 2013-09-16 MED ORDER — METHOCARBAMOL 500 MG PO TABS
500.0000 mg | ORAL_TABLET | Freq: Four times a day (QID) | ORAL | Status: DC | PRN
  Filled 2013-09-16: qty 1

## 2013-09-16 MED ORDER — 0.9 % SODIUM CHLORIDE (POUR BTL) OPTIME
TOPICAL | Status: DC | PRN
  Administered 2013-09-16: 1000 mL

## 2013-09-16 MED ORDER — CEFAZOLIN SODIUM 1-5 GM-% IV SOLN
1.0000 g | Freq: Four times a day (QID) | INTRAVENOUS | Status: AC
  Administered 2013-09-16 – 2013-09-17 (×3): 1 g via INTRAVENOUS
  Filled 2013-09-16 (×3): qty 50

## 2013-09-16 MED ORDER — FENTANYL CITRATE 0.05 MG/ML IJ SOLN
INTRAMUSCULAR | Status: DC | PRN
  Administered 2013-09-16 (×4): 50 ug via INTRAVENOUS

## 2013-09-16 SURGICAL SUPPLY — 45 items
BIT DRILL LONG 4.0 (BIT) IMPLANT
BIT DRILL SHORT 4.0 (BIT) IMPLANT
BLADE SURG 15 STRL LF DISP TIS (BLADE) ×1 IMPLANT
BLADE SURG 15 STRL SS (BLADE) ×2
COVER PERINEAL POST (MISCELLANEOUS) ×2 IMPLANT
COVER SURGICAL LIGHT HANDLE (MISCELLANEOUS) ×2 IMPLANT
COVER TABLE BACK 60X90 (DRAPES) ×2 IMPLANT
DRAPE C-ARM 42X72 X-RAY (DRAPES) ×2 IMPLANT
DRAPE STERI IOBAN 125X83 (DRAPES) ×2 IMPLANT
DRILL BIT LONG 4.0 (BIT) ×2
DRILL BIT SHORT 4.0 (BIT) ×2
DRSG ADAPTIC 3X8 NADH LF (GAUZE/BANDAGES/DRESSINGS) ×2 IMPLANT
DRSG MEPILEX BORDER 4X12 (GAUZE/BANDAGES/DRESSINGS) ×1 IMPLANT
DRSG MEPILEX BORDER 4X4 (GAUZE/BANDAGES/DRESSINGS) ×2 IMPLANT
DRSG MEPILEX BORDER 4X8 (GAUZE/BANDAGES/DRESSINGS) ×2 IMPLANT
ELECT REM PT RETURN 9FT ADLT (ELECTROSURGICAL) ×2
ELECTRODE REM PT RTRN 9FT ADLT (ELECTROSURGICAL) ×1 IMPLANT
EVACUATOR 1/8 PVC DRAIN (DRAIN) IMPLANT
GLOVE BIO SURGEON STRL SZ 6.5 (GLOVE) ×1 IMPLANT
GLOVE BIOGEL M 7.0 STRL (GLOVE) ×1 IMPLANT
GLOVE BIOGEL PI IND STRL 7.0 (GLOVE) IMPLANT
GLOVE BIOGEL PI IND STRL 9 (GLOVE) ×1 IMPLANT
GLOVE BIOGEL PI INDICATOR 7.0 (GLOVE) ×1
GLOVE BIOGEL PI INDICATOR 9 (GLOVE) ×2
GLOVE ECLIPSE 7.0 STRL STRAW (GLOVE) ×1 IMPLANT
GLOVE SURG ORTHO 9.0 STRL STRW (GLOVE) ×2 IMPLANT
GOWN STRL REUS W/ TWL XL LVL3 (GOWN DISPOSABLE) ×3 IMPLANT
GOWN STRL REUS W/TWL XL LVL3 (GOWN DISPOSABLE) ×6
GUIDE PIN 3.2MM (MISCELLANEOUS) ×2
GUIDE PIN ORTH 343X3.2XBRAD (MISCELLANEOUS) IMPLANT
GUIDE ROD 3.0 (MISCELLANEOUS) ×4
KIT BASIN OR (CUSTOM PROCEDURE TRAY) ×2 IMPLANT
KIT ROOM TURNOVER OR (KITS) ×2 IMPLANT
LINER BOOT UNIVERSAL DISP (MISCELLANEOUS) ×2 IMPLANT
MANIFOLD NEPTUNE II (INSTRUMENTS) ×1 IMPLANT
NAIL FEM 10X36 R (Nail) ×1 IMPLANT
NS IRRIG 1000ML POUR BTL (IV SOLUTION) ×2 IMPLANT
PACK GENERAL/GYN (CUSTOM PROCEDURE TRAY) ×2 IMPLANT
PAD ARMBOARD 7.5X6 YLW CONV (MISCELLANEOUS) ×4 IMPLANT
ROD GUIDE 3.0 (MISCELLANEOUS) IMPLANT
SCREW TRIGEN LOW PROF 5.0X40 (Screw) ×1 IMPLANT
SCREW TRIGEN LOW PROF 5.0X50 (Screw) ×2 IMPLANT
STAPLER VISISTAT 35W (STAPLE) IMPLANT
SUT VIC AB 2-0 CTB1 (SUTURE) IMPLANT
WATER STERILE IRR 1000ML POUR (IV SOLUTION) ×2 IMPLANT

## 2013-09-16 NOTE — Transfer of Care (Signed)
Immediate Anesthesia Transfer of Care Note  Patient: Kathryn Greene  Procedure(s) Performed: Procedure(s): INTRAMEDULLARY (IM) NAIL FEMORAL (Right)  Patient Location: PACU  Anesthesia Type:General  Level of Consciousness: awake, alert , oriented and patient cooperative  Airway & Oxygen Therapy: Patient Spontanous Breathing and Patient connected to nasal cannula oxygen  Post-op Assessment: Report given to PACU RN, Post -op Vital signs reviewed and stable and Patient moving all extremities X 4  Post vital signs: Reviewed and stable  Complications: No apparent anesthesia complications

## 2013-09-16 NOTE — ED Notes (Signed)
Pt report called and given to [place]ill, [place]

## 2013-09-16 NOTE — ED Notes (Signed)
[facility] here for pt transport, pt transferred from ED stretcher to [facility] gurney with 4 staff assist. Pt tolerated well, positioned for comfort. +PMS to extremity upon transfer.

## 2013-09-16 NOTE — Progress Notes (Signed)
Pt arrived to unit via EMS.  Pt oriented to room and equipment.  Denies pain, states a little nauseated.  Otherwise comfortable and pleasant.

## 2013-09-16 NOTE — Anesthesia Procedure Notes (Signed)
Procedure Name: Intubation Date/Time: 09/16/2013 1:08 PM Performed by: Romie MinusOCK, Jamyra Zweig K Pre-anesthesia Checklist: Patient identified, Emergency Drugs available, Suction available, Patient being monitored and Timeout performed Patient Re-evaluated:Patient Re-evaluated prior to inductionOxygen Delivery Method: Circle system utilized Preoxygenation: Pre-oxygenation with 100% oxygen Intubation Type: IV induction Ventilation: Mask ventilation without difficulty Laryngoscope Size: Miller and 2 Grade View: Grade I Tube type: Oral Tube size: 7.0 mm Number of attempts: 1 Airway Equipment and Method: Stylet Placement Confirmation: ETT inserted through vocal cords under direct vision,  positive ETCO2,  CO2 detector and breath sounds checked- equal and bilateral Secured at: 21 cm Tube secured with: Tape Dental Injury: Teeth and Oropharynx as per pre-operative assessment

## 2013-09-16 NOTE — Anesthesia Preprocedure Evaluation (Addendum)
Anesthesia Evaluation  Patient identified by MRN, date of birth, ID band Patient awake    Reviewed: Allergy & Precautions, H&P , NPO status , Patient's Chart, lab work & pertinent test results  History of Anesthesia Complications Negative for: history of anesthetic complications  Airway Mallampati: II      Dental  (+) Dental Advisory Given, Teeth Intact   Pulmonary former smoker,  breath sounds clear to auscultation        Cardiovascular negative cardio ROS  Rhythm:Regular Rate:Normal     Neuro/Psych negative neurological ROS  negative psych ROS   GI/Hepatic negative GI ROS, Neg liver ROS,   Endo/Other  negative endocrine ROS  Renal/GU negative Renal ROS     Musculoskeletal   Abdominal   Peds  Hematology   Anesthesia Other Findings   Reproductive/Obstetrics negative OB ROS                         Anesthesia Physical Anesthesia Plan  ASA: II  Anesthesia Plan: General   Post-op Pain Management:    Induction: Intravenous  Airway Management Planned: Oral ETT  Additional Equipment:   Intra-op Plan:   Post-operative Plan: Extubation in OR  Informed Consent: I have reviewed the patients History and Physical, chart, labs and discussed the procedure including the risks, benefits and alternatives for the proposed anesthesia with the patient or authorized representative who has indicated his/her understanding and acceptance.   Dental advisory given  Plan Discussed with: CRNA, Anesthesiologist and Surgeon  Anesthesia Plan Comments:         Anesthesia Quick Evaluation

## 2013-09-16 NOTE — H&P (Signed)
Kathryn Greene is an 43 y.o. female.   Chief Complaint: Right midshaft femur fracture. HPI: Patient is a 43 year old woman who states he was trying to protect her dog from the [place] when she was twisting and fell sustaining a spiral fracture to the right femur  Past Medical History  Diagnosis Date  . Lymphadenopathy   . Tonsillitis   . Seasonal allergies     Past Surgical History  Procedure Laterality Date  . Fine needle aspirate      History reviewed. No pertinent family history. Social History:  reports that she has quit smoking. She has never used smokeless tobacco. She reports that she drinks alcohol. She reports that she does not use illicit drugs.  Allergies:  Allergies  Allergen Reactions  . Codeine   . Bactrim [Sulfamethoxazole-Trimethoprim] Rash    Medications Prior to Admission  Medication Sig Dispense Refill  . cetirizine (ZYRTEC) 10 MG tablet Take 10 mg by mouth as needed.        Results for orders placed during the hospital encounter of 09/15/13 (from the past 48 hour(s))  CBC     Status: Abnormal   Collection Time    09/15/13 10:11 PM      Result Value Ref Range   WBC 10.4  4.0 - 10.5 K/uL   RBC 4.25  3.87 - 5.11 MIL/uL   Hemoglobin 12.0  12.0 - 15.0 g/dL   HCT 35.6 (*) 36.0 - 46.0 %   MCV 83.8  78.0 - 100.0 fL   MCH 28.2  26.0 - 34.0 pg   MCHC 33.7  30.0 - 36.0 g/dL   RDW 13.0  11.5 - 15.5 %   Platelets 315  150 - 400 K/uL  BASIC METABOLIC PANEL     Status: Abnormal   Collection Time    09/15/13 10:11 PM      Result Value Ref Range   Sodium 140  137 - 147 mEq/L   Potassium 3.3 (*) 3.7 - 5.3 mEq/L   Chloride 101  96 - 112 mEq/L   CO2 23  19 - 32 mEq/L   Glucose, Bld 202 (*) 70 - 99 mg/dL   BUN 10  6 - 23 mg/dL   Creatinine, Ser 0.90  0.50 - 1.10 mg/dL   Calcium 9.1  8.4 - 10.5 mg/dL   GFR calc non Af Amer 77 (*) >90 mL/min   GFR calc Af Amer 89 (*) >90 mL/min   Comment: (NOTE)     The eGFR has been calculated using the CKD EPI equation.      This calculation has not been validated in all clinical situations.     eGFR's persistently <90 mL/min signify possible Chronic Kidney     Disease.   Anion gap 16 (*) 5 - 15  SURGICAL PCR SCREEN     Status: Abnormal   Collection Time    09/16/13  4:25 AM      Result Value Ref Range   MRSA, PCR NEGATIVE  NEGATIVE   Staphylococcus aureus POSITIVE (*) NEGATIVE   Comment:            The Xpert SA Assay (FDA     approved for NASAL specimens     in patients over 76 years of age),     is one component of     a comprehensive surveillance     program.  Test performance has     been validated by [employer] for patients  greater     than or equal to 64 year old.     It is not intended     to diagnose infection nor to     guide or monitor treatment.  BASIC METABOLIC PANEL     Status: Abnormal   Collection Time    09/16/13  7:00 AM      Result Value Ref Range   Sodium 142  137 - 147 mEq/L   Potassium 4.1  3.7 - 5.3 mEq/L   Chloride 107  96 - 112 mEq/L   CO2 18 (*) 19 - 32 mEq/L   Glucose, Bld 143 (*) 70 - 99 mg/dL   BUN 8  6 - 23 mg/dL   Creatinine, Ser 0.61  0.50 - 1.10 mg/dL   Calcium 8.1 (*) 8.4 - 10.5 mg/dL   GFR calc non Af Amer >90  >90 mL/min   GFR calc Af Amer >90  >90 mL/min   Comment: (NOTE)     The eGFR has been calculated using the CKD EPI equation.     This calculation has not been validated in all clinical situations.     eGFR's persistently <90 mL/min signify possible Chronic Kidney     Disease.   Anion gap 17 (*) 5 - 15  CBC     Status: None   Collection Time    09/16/13  7:00 AM      Result Value Ref Range   WBC 9.1  4.0 - 10.5 K/uL   RBC 4.30  3.87 - 5.11 MIL/uL   Hemoglobin 12.2  12.0 - 15.0 g/dL   HCT 36.9  36.0 - 46.0 %   MCV 85.8  78.0 - 100.0 fL   MCH 28.4  26.0 - 34.0 pg   MCHC 33.1  30.0 - 36.0 g/dL   RDW 13.6  11.5 - 15.5 %   Platelets 291  150 - 400 K/uL   Dg Chest 1 View  09/15/2013   CLINICAL DATA:  Femur fracture  EXAM: CHEST - 1 VIEW   COMPARISON:  07/11/2012  FINDINGS: The heart size and mediastinal contours are within normal limits. Both lungs are clear. The visualized skeletal structures are unremarkable.  IMPRESSION: No active disease.   Electronically Signed   By: Franchot Gallo M.D.   On: 09/15/2013 22:57   Dg Femur Right  09/15/2013   CLINICAL DATA:  Fall.  Limited imaging requested.  EXAM: RIGHT FEMUR - 1 VIEW  COMPARISON:  None.  FINDINGS: There is a comminuted, predominantly transverse fracture through the proximal femoral diaphysis with 100% posterior and medial displacement. The hip and knee is located in the frontal projection.  IMPRESSION: Displaced right femur proximal diaphysis fracture, as above.   Electronically Signed   By: Jorje Guild M.D.   On: 09/15/2013 22:56    Review of Systems  All other systems reviewed and are negative.   Blood pressure 126/64, pulse 61, temperature 97.9 F (36.6 C), temperature source Oral, resp. rate 20, height 5' 2"  (1.575 m), weight 68.584 kg (151 lb 3.2 oz), last menstrual period 08/25/2013, SpO2 100.00%. Physical Exam  On examination patient's right lower extremity is neurovascularly intact she has good pulses. Radiographs shows a segmental midshaft femur fracture on the right Assessment/Plan Assessment spiral right midshaft femur fracture with segmental defect.  Plan: We'll plan for intramedullary nail fixation. Risks and benefits were discussed including infection DVT nonunion need for additional surgery. Patient states she understands and wished to proceed at this time.  DUDA,MARCUS V 09/16/2013, 8:06 AM

## 2013-09-16 NOTE — H&P (Signed)
NAME: Kathryn Greene MRN:   [id number] DOB:   06/22/1970     HISTORY AND PHYSICAL  CHIEF COMPLAINT:  Right leg pain  HISTORY:   Kathryn Greene a 43 y.o. female brought in by ambulance who presents to the Emergency Department complaining of a fall that occurred just prior to arrival. Pt states she was walking her dog when another dog attacked her dog. States she attempted to fight the other dog off resulting her tripping and falling. She admits to hitting her head, however, no LOC. She now c/o constant, moderate R lower extremity pain that is progressively worsening. Pain is exacerbated with movement and palpation. No alleviating factors at this time. Dr. Sherlean FootLucey with Orthopedics called.   PAST MEDICAL HISTORY:   Past Medical History  Diagnosis Date  . Lymphadenopathy   . Tonsillitis   . Seasonal allergies     PAST SURGICAL HISTORY:   Past Surgical History  Procedure Laterality Date  . Fine needle aspirate      MEDICATIONS:   Medications Prior to Admission  Medication Sig Dispense Refill  . cetirizine (ZYRTEC) 10 MG tablet Take 10 mg by mouth as needed.        ALLERGIES:   Allergies  Allergen Reactions  . Codeine   . Bactrim [Sulfamethoxazole-Trimethoprim] Rash    REVIEW OF SYSTEMS:   Negative except HPI  FAMILY HISTORY:  History reviewed. No pertinent family history.  SOCIAL HISTORY:   reports that she has quit smoking. She has never used smokeless tobacco. She reports that she drinks alcohol. She reports that she does not use illicit drugs.  PHYSICAL EXAM:  General appearance: alert, cooperative and no distress Resp: clear to auscultation bilaterally Cardio: regular rate and rhythm, S1, S2 normal, no murmur, click, rub or gallop GI: soft, non-tender; bowel sounds normal; no masses,  no organomegaly Extremities: extremities normal, atraumatic, no cyanosis or edema and Homans sign is negative, no sign of DVT Pulses: 2+ and symmetric Skin: Skin color, texture, turgor normal. No  rashes or lesions Incision/Wound:    LABORATORY STUDIES:  Recent Labs  09/15/13 2211 09/16/13 0700  WBC 10.4 9.1  HGB 12.0 12.2  HCT 35.6* 36.9  PLT 315 291     Recent Labs  09/15/13 2211 09/16/13 0700  NA 140 142  K 3.3* 4.1  CL 101 107  CO2 23 18*  GLUCOSE 202* 143*  BUN 10 8  CREATININE 0.90 0.61  CALCIUM 9.1 8.1*    STUDIES/RESULTS:  Dg Chest 1 View  09/15/2013   CLINICAL DATA:  Femur fracture  EXAM: CHEST - 1 VIEW  COMPARISON:  07/11/2012  FINDINGS: The heart size and mediastinal contours are within normal limits. Both lungs are clear. The visualized skeletal structures are unremarkable.  IMPRESSION: No active disease.   Electronically Signed   By: Marlan Palauharles  Clark M.D.   On: 09/15/2013 22:57   Dg Femur Right  09/15/2013   CLINICAL DATA:  Fall.  Limited imaging requested.  EXAM: RIGHT FEMUR - 1 VIEW  COMPARISON:  None.  FINDINGS: There is a comminuted, predominantly transverse fracture through the proximal femoral diaphysis with 100% posterior and medial displacement. The hip and knee is located in the frontal projection.  IMPRESSION: Displaced right femur proximal diaphysis fracture, as above.   Electronically Signed   By: Tiburcio PeaJonathan  Watts M.D.   On: 09/15/2013 22:56    ASSESSMENT:  Right femur fracture        Active Problems:   Femur fracture, right  PLAN: Patient was transferred to [facility] overnight.  I admitted patient, orders completed and patient made NPO.  Plan for surgical intervention 09/16/13 by an orthopedic surgeon.  Kathryn Greene 09/16/2013. 9:07 AM

## 2013-09-16 NOTE — Progress Notes (Signed)
Paged admitting Dr. Sherlean FootLucey to inform pt arrived to floor.  Awaiting orders.

## 2013-09-16 NOTE — Op Note (Signed)
09/15/2013 - 09/16/2013  2:42 PM  PATIENT:  Kathryn Greene    PRE-OPERATIVE DIAGNOSIS:  right femur fracture  POST-OPERATIVE DIAGNOSIS:  Same  PROCEDURE:  INTRAMEDULLARY (IM) NAIL FEMORAL  SURGEON:  Nadara MustardUDA,Marleena Shubert V, MD  PHYSICIAN ASSISTANT:None ANESTHESIA:   General  PREOPERATIVE INDICATIONS:  Kathryn Greene is a  43 y.o. female with a diagnosis of right femur fracture who failed conservative measures and elected for surgical management.    The risks benefits and alternatives were discussed with the patient preoperatively including but not limited to the risks of infection, bleeding, nerve injury, cardiopulmonary complications, the need for revision surgery, among others, and the patient was willing to proceed.  OPERATIVE IMPLANTS: [employer] femoral nail 10 x 360 mm. Locked proximally and distally.  OPERATIVE FINDINGS: Open reduction at the fracture site to 2 comminution with bone fragments and the distal fragment  OPERATIVE PROCEDURE: Patient was brought to the operating room and underwent a general anesthetic. After adequate levels of anesthesia were obtained patient's right lower extremity was prepped using DuraPrep draped into a sterile field with the [facility]. The fracture was reduced. Time out was called. Incision was made laterally just proximal to the greater trochanter. A guidewire was inserted down the femoral canal through the greater trochanter. This was then overreamed. A guidewire was then inserted down the canal of the femur. The patient had significant comminution and due to the comminution with bony fragments in the distal aspect of the fracture closed reduction was not obtained. Incision was made laterally the fracture was reduced and the guidewire inserted across the fracture site. The femoral canal was sequentially reamed to use 11.5 mm for a 10 mm nail. The nail was inserted and locked proximally. Freehand technique was then used to lock the nail distally. C-arm  fluoroscopy verified reduction alignment in both AP and lateral planes. Of note there was a very large segmental fragment that was left in situ. The wounds were irrigated with normal saline the fascia was closed using 0 Vicryl subcutaneous is closed using 2-0 Vicryl and the skin was closed using staples. The wounds were covered with Mepilex dressing. Patient was extubated taken to the PACU in stable condition.

## 2013-09-16 NOTE — ED Notes (Signed)
Pt report given to Anna, RN

## 2013-09-17 ENCOUNTER — Encounter (HOSPITAL_COMMUNITY): Payer: Self-pay | Admitting: Orthopedic Surgery

## 2013-09-17 LAB — URINE CULTURE

## 2013-09-17 MED ORDER — ASPIRIN EC 325 MG PO TBEC: 325.0000 mg | DELAYED_RELEASE_TABLET | Freq: Every day | ORAL | Status: AC

## 2013-09-17 MED ORDER — OXYCODONE-ACETAMINOPHEN 5-325 MG PO TABS: 1.0000 | ORAL_TABLET | ORAL | Status: AC | PRN

## 2013-09-17 NOTE — Progress Notes (Signed)
Pt. Discharge to home. Discharge instructions given to patient. No question verbalized.  DME needed delivered to patient's room.

## 2013-09-17 NOTE — Discharge Planning (Signed)
Spoke to Ms. Scarlette CalicoFrances and she was uncertain at the moment concerning her DME needs. She advised me that she would call back prior to discharging.

## 2013-09-17 NOTE — Discharge Instructions (Signed)
Touchdown weightbearing on the right. May shower and get incision wet in 3 days. Recommend calcium 1500 mg a day and vitamin D3 2000 units a day.

## 2013-09-17 NOTE — Evaluation (Signed)
Physical Therapy Evaluation Patient Details Name: Kathryn Greene MRN: [id number] DOB: 27-Apr-1970 Today's Date: 09/17/2013   History of Present Illness  43 y.o. female sustained a fall at home and suffered Rt femur fx. S/p IM femoral nail.   Clinical Impression  Pt adm due to above. Pt seen for evaluation to assess safety with mobility for D/C home today. Pt able to ambulate 180 ft at supervision to mod I level with RW, demo good ability to maintain TDWB status. Educated on Arts administrator. Reports she will have (A) PRN at home. Recommend HHPT for continued therapy upon acute D/C and OT evaluation for ADL assessment.  Pt will require RW for mobility upon acute D/C, RN made aware.     Follow Up Recommendations Home health PT;Supervision/Assistance - 24 hour    Equipment Recommendations  Rolling walker with 5" wheels    Recommendations for Other Services OT consult     Precautions / Restrictions Precautions Precautions: None Restrictions Weight Bearing Restrictions: Yes RLE Weight Bearing: Touchdown weight bearing      Mobility  Bed Mobility Overal bed mobility: Needs Assistance Bed Mobility: Supine to Sit;Sit to Supine     Supine to sit: Min guard Sit to supine: Min guard   General bed mobility comments: pt required setup (A) and cues to use sheet and hook around Rt LE to bring in/out of bed secondary to pain and weakness; cues for sequencing  Transfers Overall transfer level: Needs assistance Equipment used: Rolling walker (2 wheeled) Transfers: Sit to/from Stand Sit to Stand: Supervision;Modified independent (Device/Increase time)         General transfer comment: initially supervision for cues for safety with RW; 2nd transfer to stand was mod I; demo good ability to follow TDWB instructions  Ambulation/Gait Ambulation/Gait assistance: Supervision Ambulation Distance (Feet): 180 Feet Assistive device: Rolling walker (2 wheeled) Gait Pattern/deviations:  Step-to pattern Gait velocity: decr Gait velocity interpretation: Below normal speed for age/gender General Gait Details: supervision for safety initially; min cues for sequencing with RW; demo good ability to maintain TDWB status   Stairs Stairs: Yes Stairs assistance: Min guard Stair Management: Step to pattern;Backwards;With walker;No rails Number of Stairs: 1 General stair comments: min guard to block RW; cues for sequencing and safety  Wheelchair Mobility    Modified Rankin (Stroke Patients Only)       Balance Overall balance assessment: No apparent balance deficits (not formally assessed)                                           Pertinent Vitals/Pain 4/10 on anterior portion of Rt LE; patient repositioned for comfort. Pt premedicated by RN.     Home Living Family/patient expects to be discharged to:: Private residence Living Arrangements: Non-relatives/Friends Available Help at Discharge: Friend(s);Available 24 hours/day Type of Home: House Home Access: Stairs to enter Entrance Stairs-Rails: None Entrance Stairs-Number of Steps: 1-2 Home Layout: Able to live on main level with bedroom/bathroom;Two level Home Equipment: None Additional Comments: pt has a walk in shower that is on 2nd floor     Prior Function Level of Independence: Independent               Hand Dominance        Extremity/Trunk Assessment   Upper Extremity Assessment: Defer to OT evaluation           Lower Extremity Assessment: RLE  deficits/detail RLE Deficits / Details: hip 2+/5; quad 2+/5 limited by pain    Cervical / Trunk Assessment: Normal  Communication   Communication: No difficulties  Cognition Arousal/Alertness: Awake/alert Behavior During Therapy: WFL for tasks assessed/performed Overall Cognitive Status: Within Functional Limits for tasks assessed                      General Comments General comments (skin integrity, edema, etc.): pt  with difficulty donning/doffing socks; will benefit from OT evaluation    Exercises        Assessment/Plan    PT Assessment Patent does not need any further PT services  PT Diagnosis     PT Problem List    PT Treatment Interventions     PT Goals (Current goals can be found in the Care Plan section) Acute Rehab PT Goals Patient Stated Goal: home today PT Goal Formulation: No goals set, d/c therapy    Frequency     Barriers to discharge        Co-evaluation               End of Session Equipment Utilized During Treatment: Gait belt Activity Tolerance: Patient tolerated treatment well Patient left: in bed;with call bell/phone within reach Nurse Communication: Mobility status;Other (comment) (DME needs)    Functional Assessment Tool Used: clinical judgement Functional Limitation: Mobility: Walking and moving around Mobility: Walking and Moving Around Current Status [phone]): At least 1 percent but less than 20 percent impaired, limited or restricted Mobility: Walking and Moving Around Goal Status [phone]): At least 1 percent but less than 20 percent impaired, limited or restricted Mobility: Walking and Moving Around Discharge Status [phone]): At least 1 percent but less than 20 percent impaired, limited or restricted    Time: 0853-0925 PT Time Calculation (min): 32 min   Charges:   PT Evaluation $Initial PT Evaluation Tier I: 1 Procedure PT Treatments $Gait Training: 23-37 mins   PT G Codes:   Functional Assessment Tool Used: clinical judgement Functional Limitation: Mobility: Walking and moving around    [place], PlummerBrittany N, [place]  [phone] 09/17/2013, 9:57 AM

## 2013-09-17 NOTE — Progress Notes (Signed)
Occupational Therapy Evaluation Patient Details Name: Kathryn Greene MRN: [id number] DOB: 10/02/1970 Today's Date: 09/17/2013    History of Present Illness 43 y.o. female sustained a fall at home and suffered spiral fx to Rt femur. Pt s/p IM nail.     Clinical Impression   PTA pt was Independent with ADLs and functional mobility. Pt overall at Supervision/Mod I level for functional mobility. Educated pt with return demo of use of AE for LB ADLs. Pt would benefit from 3N1 and shower seat, however has no further OT needs.     Follow Up Recommendations  No OT follow up;Supervision/Assistance - 24 hour    Equipment Recommendations  3 in 1 bedside comode;Tub/shower seat       Precautions / Restrictions Precautions Precautions: None Restrictions Weight Bearing Restrictions: Yes RLE Weight Bearing: Touchdown weight bearing      Mobility Bed Mobility Overal bed mobility: Needs Assistance Bed Mobility: Supine to Sit;Sit to Supine     Supine to sit: Min guard Sit to supine: Min guard   General bed mobility comments: Pt able to use gait belt around Rt LE to manage on/off bed.   Transfers Overall transfer level: Needs assistance Equipment used: Rolling walker (2 wheeled) Transfers: Sit to/from Stand Sit to Stand: Supervision;Modified independent (Device/Increase time)         General transfer comment: Pt with good technique and hand placement. Maintains TDWB status    Balance Overall balance assessment: No apparent balance deficits (not formally assessed)                                          ADL Overall ADL's : Needs assistance/impaired Eating/Feeding: Independent;Sitting   Grooming: Supervision/safety;Standing   Upper Body Bathing: Set up;Sitting   Lower Body Bathing: Minimal assistance;Sit to/from stand   Upper Body Dressing : Set up;Sitting   Lower Body Dressing: Minimal assistance;Sit to/from stand   Toilet Transfer: Supervision/safety;RW    Toileting- Clothing Manipulation and Hygiene: Supervision/safety;Sit to/from stand   Tub/ Shower Transfer: Walk-in shower;Supervision/safety;Ambulation;Rolling walker   Functional mobility during ADLs: Supervision/safety;Rolling walker General ADL Comments: Pt moving well and educated on AE for LB ADLs with return demo. Educated pt on fall prevention and energy conservation principles.      Vision  Pt reports no change from baseline. No apparent visual deficits.                    Perception Perception Perception Tested?: No   Praxis Praxis Praxis tested?: Within functional limits    Pertinent Vitals/Pain NAD        Extremity/Trunk Assessment Upper Extremity Assessment Upper Extremity Assessment: Overall WFL for tasks assessed   Lower Extremity Assessment Lower Extremity Assessment: Defer to PT evaluation    Cervical / Trunk Assessment Cervical / Trunk Assessment: Normal   Communication Communication Communication: No difficulties   Cognition Arousal/Alertness: Awake/alert Behavior During Therapy: WFL for tasks assessed/performed Overall Cognitive Status: Within Functional Limits for tasks assessed                                Home Living Family/patient expects to be discharged to:: Private residence Living Arrangements: Non-relatives/Friends Available Help at Discharge: Friend(s);Available 24 hours/day Type of Home: House Home Access: Stairs to enter Entrance Stairs-Number of Steps: 1-2 Entrance Stairs-Rails: None Home Layout: Able to live   on main level with bedroom/bathroom;Two level Alternate Level Stairs-Number of Steps: 15 Alternate Level Stairs-Rails: Can reach both;Left;Right Bathroom Shower/Tub: Walk-in shower   Bathroom Toilet: Standard     Home Equipment: None   Additional Comments: pt has a walk in shower that is on 2nd floor       Prior Functioning/Environment Level of Independence: Independent                        End of Session Equipment Utilized During Treatment: Gait belt;Rolling walker;Other (comment) (AE kit)  Activity Tolerance: Patient tolerated treatment well Patient left: in bed;with call bell/phone within reach   Time: 1053-1133 OT Time Calculation (min): 40 min Charges:  OT General Charges $OT Visit: 1 Procedure OT Evaluation $Initial OT Evaluation Tier I: 1 Procedure OT Treatments $Self Care/Home Management : 23-37 mins  Miller, LeeAnn M [phone] 09/17/2013, 11:32 AM    

## 2013-09-17 NOTE — Discharge Summary (Signed)
Physician Discharge Summary  Patient ID: Kathryn PortaFrances Greene MRN: [id number] DOB/AGE: 44/02/1970 44 y.o.  Admit date: 09/15/2013 Discharge date: 09/17/2013  Admission Diagnoses: Right femur fracture  Discharge Diagnoses:  Active Problems:   Femur fracture, right   Discharged Condition: stable  Hospital Course: Patient's hospital course was essentially unremarkable. She underwent intramedullary nail fixation of the right femur. Do to comminution at the fracture site the fracture site was opened to dislodge bone fragments. Patient progressed well postoperatively and was discharged to home in stable condition.  Consults: None  Significant Diagnostic Studies: labs: Routine labs  Treatments: surgery: See operative note  Discharge Exam: Blood pressure 103/50, pulse 69, temperature 98.2 F (36.8 C), temperature source Oral, resp. rate 16, height 5\' 2"  (1.575 m), weight 68.584 kg (151 lb 3.2 oz), last menstrual period 08/25/2013, SpO2 100.00%. Incision/Wound: dressing clean dry and intact  Disposition: 01-Home or Self Care  Discharge Instructions   Touch down weight bearing    Complete by:  As directed   Laterality:  right  Extremity:  Lower            Medication List         aspirin EC 325 MG tablet  Take 1 tablet (325 mg total) by mouth daily.     cetirizine 10 MG tablet  Commonly known as:  ZYRTEC  Take 10 mg by mouth daily as needed for allergies.     oxyCODONE-acetaminophen 5-325 MG per tablet  Commonly known as:  ROXICET  Take 1 tablet by mouth every 4 (four) hours as needed for severe pain.     Vitamin D (Ergocalciferol) 50000 UNITS Caps capsule  Commonly known as:  DRISDOL  Take 50,000 Units by mouth 2 (two) times a week. Monday and thursday           Follow-up Information   Follow up with DUDA,MARCUS V, MD In 1 week.   Specialty:  Orthopedic Surgery   Contact information:   [address] [place] [address] [phone]       Signed: Nadara MustardDUDA,MARCUS  V 09/17/2013, 6:48 AM

## 2013-09-17 NOTE — Anesthesia Postprocedure Evaluation (Signed)
  Anesthesia Post-op Note  Patient: Kathryn Greene  Procedure(s) Performed: Procedure(s): INTRAMEDULLARY (IM) NAIL FEMORAL (Right)  Patient Location: PACU  Anesthesia Type:General  Level of Consciousness: awake  Airway and Oxygen Therapy: Patient Spontanous Breathing  Post-op Pain: mild  Post-op Assessment: Post-op Vital signs reviewed  Post-op Vital Signs: Reviewed  Last Vitals:  Filed Vitals:   09/17/13 1405  BP: 108/50  Pulse: 71  Temp: 36.3 C  Resp: 18    Complications: No apparent anesthesia complications

## 2013-09-21 ENCOUNTER — Other Ambulatory Visit (HOSPITAL_COMMUNITY): Payer: Self-pay | Admitting: Orthopedic Surgery

## 2013-09-21 ENCOUNTER — Ambulatory Visit (HOSPITAL_COMMUNITY)
Admission: RE | Admit: 2013-09-21 | Discharge: 2013-09-21 | Disposition: A | Discharge status: Home / Self Care | Payer: BC Managed Care – PPO | Source: Ambulatory Visit | Attending: Orthopedic Surgery | Admitting: Orthopedic Surgery

## 2013-09-21 DIAGNOSIS — M79606 Pain in leg, unspecified: Secondary | ICD-10-CM

## 2013-09-21 DIAGNOSIS — R609 Edema, unspecified: Secondary | ICD-10-CM

## 2013-09-21 DIAGNOSIS — M79609 Pain in unspecified limb: Secondary | ICD-10-CM | POA: Insufficient documentation

## 2013-09-21 DIAGNOSIS — M7989 Other specified soft tissue disorders: Secondary | ICD-10-CM | POA: Insufficient documentation

## 2013-09-21 DIAGNOSIS — S7290XA Unspecified fracture of unspecified femur, initial encounter for closed fracture: Secondary | ICD-10-CM

## 2013-09-21 DIAGNOSIS — R591 Generalized enlarged lymph nodes: Secondary | ICD-10-CM

## 2013-09-21 DIAGNOSIS — M79604 Pain in right leg: Secondary | ICD-10-CM

## 2013-09-21 NOTE — Care Management (Signed)
09-21-13 Patient called office asking for home health .  Patient discharged PT recommended home health PT , no MD order . Advised patient to call Dr Audrie Liauda's office to have home health ordered and arranged . Patient agreeable . Ronny FlurryHeather Wynter Isaacs RN BSN

## 2013-09-21 NOTE — Progress Notes (Signed)
Right lower extremity venous duplex completed.  Right:  No evidence of DVT, superficial thrombosis, or Baker's cyst.  Left:  Negative for DVT in the common femoral vein.  

## 2014-02-15 ENCOUNTER — Other Ambulatory Visit (HOSPITAL_COMMUNITY): Payer: Self-pay | Admitting: Orthopedic Surgery

## 2014-02-15 DIAGNOSIS — M5441 Lumbago with sciatica, right side: Secondary | ICD-10-CM

## 2014-03-03 ENCOUNTER — Ambulatory Visit (HOSPITAL_COMMUNITY)
Admission: RE | Admit: 2014-03-03 | Discharge: 2014-03-03 | Disposition: A | Discharge status: Home / Self Care | Payer: BLUE CROSS/BLUE SHIELD | Source: Ambulatory Visit | Attending: Orthopedic Surgery | Admitting: Orthopedic Surgery

## 2014-03-03 DIAGNOSIS — M8938 Hypertrophy of bone, other site: Secondary | ICD-10-CM | POA: Diagnosis not present

## 2014-03-03 DIAGNOSIS — G9529 Other cord compression: Secondary | ICD-10-CM | POA: Diagnosis not present

## 2014-03-03 DIAGNOSIS — M545 Low back pain: Secondary | ICD-10-CM | POA: Diagnosis present

## 2014-03-03 DIAGNOSIS — M5127 Other intervertebral disc displacement, lumbosacral region: Secondary | ICD-10-CM | POA: Diagnosis not present

## 2014-03-03 DIAGNOSIS — M5441 Lumbago with sciatica, right side: Secondary | ICD-10-CM

## 2014-08-26 ENCOUNTER — Other Ambulatory Visit: Payer: Self-pay | Admitting: Orthopaedic Surgery

## 2014-08-26 DIAGNOSIS — M545 Low back pain: Secondary | ICD-10-CM

## 2014-09-06 ENCOUNTER — Ambulatory Visit
Admission: RE | Admit: 2014-09-06 | Discharge: 2014-09-06 | Disposition: A | Discharge status: Home / Self Care | Payer: BLUE CROSS/BLUE SHIELD | Source: Ambulatory Visit | Attending: Orthopaedic Surgery | Admitting: Orthopaedic Surgery

## 2014-09-06 DIAGNOSIS — M545 Low back pain: Secondary | ICD-10-CM

## 2015-12-08 ENCOUNTER — Encounter: Payer: Commercial Managed Care - PPO | Attending: Family Medicine | Admitting: Skilled Nursing Facility1

## 2015-12-08 ENCOUNTER — Encounter: Payer: Self-pay | Admitting: Skilled Nursing Facility1

## 2015-12-08 DIAGNOSIS — R7303 Prediabetes: Secondary | ICD-10-CM | POA: Diagnosis not present

## 2015-12-08 DIAGNOSIS — Z713 Dietary counseling and surveillance: Secondary | ICD-10-CM | POA: Diagnosis present

## 2015-12-08 NOTE — Progress Notes (Signed)
  Medical Nutrition Therapy:  Appt start time: 3:00 end time:  4:00   Assessment:  Primary concerns today: referred for prediabetes. Pt states diabetes runs in her family. Pt states she broke her leg 2 years ago and prior to that her sleep had been eradict but hen after that time the pain medicines made sleep difficult. Pt states he leg still has stabbing pains. Pt states she has done physical activity. Pt states she has a mild case of sleep apnea.   Preferred Learning Style:   No preference indicated   Learning Readiness:   Ready  MEDICATIONS: See List   DIETARY INTAKE:  Usual eating pattern includes 2 meals and 1 snacks per day.  Everyday foods include none stated.  Avoided foods include none stated.    24-hr recall:  B ( AM): none  Snk ( AM): yogurt L ( PM): order in Snk ( PM):  D ( PM): chicken, salad Snk ( PM): ice cream Beverages: coffee, water, beer  Usual physical activity: ADL's  Estimated energy needs: 1600 calories 180 g carbohydrates 120 g protein 44 g fat  Progress Towards Goal(s):  In progress.    Intervention:  Nutrition counseling for prediabetes. Dietitian educated the pt on A1C, diabetes progression, and balanced meals. Goals: -Eat 3 meals a day and snacks in between (if you are hungry for the snacks) -A meal: carbohydrate, protein, vegetable -A snack: A Fruit OR Vegetable AND Protein -Honor your body by listening to your hunger and fullness cues -First thought: Am I hungry? -Second thought: (if the answer is yes I am hungry) Does this meal have vegetables? Protein? Carbohydrate?  -Third thought: Are there more vegetables on my plate compared to Protein and Carbohydrates? -After you have finished your first serving Do Not go back for more until you have waited 20-30 minutes and checked in with your body by asking Am I Still Hungry?   -It takes time  -Try to eat within an hour/hour and a half of waking  -If you eat snacks you will eat about  every 3 hours if you do not have snacks you will eat about every 4-5 hours  -Wait 20-30 minutes after you have eaten for your satisfaction cues to register   Teaching Method Utilized:  Visual Auditory Hands on  Handouts given during visit include:  Living well with diabetes  Snack sheet  Should I eat  Demonstrated degree of understanding via:  Teach Back   Monitoring/Evaluation:  Dietary intake, exercise, A1C, and body weight prn.

## 2015-12-08 NOTE — Patient Instructions (Addendum)
-  Eat 3 meals a day and snacks in between (if you are hungry for the snacks) -A meal: carbohydrate, protein, vegetable -A snack: A Fruit OR Vegetable AND Protein -Honor your body by listening to your hunger and fullness cues -First thought: Am I hungry? -Second thought: (if the answer is yes I am hungry) Does this meal have vegetables? Protein? Carbohydrate?  -Third thought: Are there more vegetables on my plate compared to Protein and Carbohydrates? -After you have finished your first serving Do Not go back for more until you have waited 20-30 minutes and checked in with your body by asking Am I Still Hungry?   -It takes time  -Try to eat within an hour/hour and a half of waking  -If you eat snacks you will eat about every 3 hours if you do not have snacks you will eat about every 4-5 hours  -Wait 20-30 minutes after you have eaten for your satisfaction cues to register

## 2016-02-25 ENCOUNTER — Emergency Department (HOSPITAL_BASED_OUTPATIENT_CLINIC_OR_DEPARTMENT_OTHER)
Admission: EM | Admit: 2016-02-25 | Discharge: 2016-02-25 | Disposition: A | Discharge status: Home / Self Care | Payer: Commercial Managed Care - PPO | Attending: Emergency Medicine | Admitting: Emergency Medicine

## 2016-02-25 ENCOUNTER — Encounter (HOSPITAL_BASED_OUTPATIENT_CLINIC_OR_DEPARTMENT_OTHER): Payer: Self-pay | Admitting: Emergency Medicine

## 2016-02-25 DIAGNOSIS — Z87891 Personal history of nicotine dependence: Secondary | ICD-10-CM | POA: Insufficient documentation

## 2016-02-25 DIAGNOSIS — J029 Acute pharyngitis, unspecified: Secondary | ICD-10-CM | POA: Diagnosis present

## 2016-02-25 LAB — RAPID STREP SCREEN (MED CTR MEBANE ONLY): Streptococcus, Group A Screen (Direct): NEGATIVE

## 2016-02-25 MED ORDER — DEXAMETHASONE SODIUM PHOSPHATE 10 MG/ML IJ SOLN
10.0000 mg | Freq: Once | INTRAMUSCULAR | Status: AC
  Administered 2016-02-25: 10 mg via INTRAMUSCULAR
  Filled 2016-02-25: qty 1

## 2016-02-25 NOTE — ED Triage Notes (Signed)
Pt c/o sore throat since last night, as well as cough and HA

## 2016-02-25 NOTE — ED Notes (Signed)
ED Provider at bedside. 

## 2016-02-25 NOTE — ED Notes (Signed)
Sore throat onset last p.m. Feels like she is losing her voice. Now starting to have body aches.

## 2016-02-25 NOTE — ED Provider Notes (Signed)
[facility] Provider Note   CSN: [id number] Arrival date & time: 02/25/16  1648  By signing my name below, I, Orpah Cobb, attest that this documentation has been prepared under the direction and in the presence of Felicie Morn, NP-C. Electronically Signed: Orpah Cobb , ED Scribe. 02/25/16. 6:33 PM.   History   Chief Complaint Chief Complaint  Patient presents with  . Sore Throat    HPI  HPI Comments: Kathryn Greene is a 46 y.o. female who presents to the Emergency Department complaining of mild URI symptoms with suddwen onset x12 hours. Pt states that she awoke this morning with a cough and associated sore throat and rhinorrhea. She also reports myalgia. Pt has done nose rinses, and drank hot tea with lemon/honey with mild relief. She denies nausea, vomiting. Of note, pt is allergic to Bactrim and Codeine. Pt has family hx of diabetes mellitus.    The history is provided by the patient. No language interpreter was used.    Past Medical History:  Diagnosis Date  . Lymphadenopathy   . Seasonal allergies   . Tonsillitis     Patient Active Problem List   Diagnosis Date Noted  . Femur fracture, right (HCC) 09/15/2013  . Tonsillitis 06/29/2011  . Lymphadenopathy     Past Surgical History:  Procedure Laterality Date  . FEMUR IM NAIL Right 09/16/2013   Procedure: INTRAMEDULLARY (IM) NAIL FEMORAL;  Surgeon: Nadara Mustard, MD;  Location: [facility] OR;  Service: Orthopedics;  Laterality: Right;  . fine needle aspirate      OB History    No data available       Home Medications    Prior to Admission medications   Medication Sig Start Date End Date Taking? Authorizing Provider  aspirin EC 325 MG tablet Take 1 tablet (325 mg total) by mouth daily. 09/17/13   Nadara Mustard, MD  cetirizine (ZYRTEC) 10 MG tablet Take 10 mg by mouth daily as needed for allergies.     Historical Provider, MD  oxyCODONE-acetaminophen (ROXICET) 5-325 MG per tablet Take 1 tablet by mouth  every 4 (four) hours as needed for severe pain. 09/17/13   Nadara Mustard, MD  Vitamin D, Ergocalciferol, (DRISDOL) 50000 UNITS CAPS capsule Take 50,000 Units by mouth 2 (two) times a week. Monday and thursday    Historical Provider, MD    Family History History reviewed. No pertinent family history.  Social History Social History  Substance Use Topics  . Smoking status: Former Games developer  . Smokeless tobacco: Never Used  . Alcohol use Yes     Comment: occasionally     Allergies   Codeine and Bactrim [sulfamethoxazole-trimethoprim]   Review of Systems Review of Systems  HENT: Positive for rhinorrhea and sore throat.   Respiratory: Positive for cough.   Gastrointestinal: Negative for nausea and vomiting.  Musculoskeletal: Positive for myalgias.  All other systems reviewed and are negative.    Physical Exam Updated Vital Signs BP 160/95   Pulse 106   Temp 98.4 F (36.9 C) (Oral)   Resp 20   Ht 5\' 2"  (1.575 m)   Wt 155 lb (70.3 kg)   LMP 02/04/2016   SpO2 100%   BMI 28.35 kg/m   Physical Exam  Constitutional: She is oriented to person, place, and time. She appears well-developed and well-nourished. No distress.  HENT:  Mouth/Throat: Posterior oropharyngeal erythema present. No oropharyngeal exudate.  Throat is erythematous, otherwise the exam is unremarkable.   Eyes: Conjunctivae are  normal.  Neck: Neck supple.  Cardiovascular: Normal rate and regular rhythm.   Pulmonary/Chest: Effort normal and breath sounds normal.  Abdominal: Soft. Bowel sounds are normal.  Musculoskeletal: She exhibits no edema.  Lymphadenopathy:    She has no cervical adenopathy.  Neurological: She is alert and oriented to person, place, and time.  Skin: Skin is warm and dry. No rash noted.  Psychiatric: She has a normal mood and affect.     ED Treatments / Results   DIAGNOSTIC STUDIES: Oxygen Saturation is 100% on RA, normal by my interpretation.   COORDINATION OF CARE: 6:33  PM-Discussed next steps with pt. Pt verbalized understanding and is agreeable with the plan.    Labs (all labs ordered are listed, but only abnormal results are displayed) Labs Reviewed  RAPID STRE[facility] SCREEN [facility])  CULTURE, GROUP A STREP [facility])    EKG  EKG Interpretation None       Radiology No results found.  Procedures Procedures (including critical care time)  Medications Ordered in ED Medications  dexamethasone (DECADRON) injection 10 mg (10 mg Intramuscular Given 02/25/16 1918)     Initial Impression / Assessment and Plan / ED Course  I have reviewed the triage vital signs and the nursing notes.  Pertinent labs & imaging results that were available during my care of the patient were reviewed by me and considered in my medical decision making (see chart for details).  Clinical Course   Pt with negative strep. Diagnosis of viral pharyngitis. No abx indicated at this time. Discussed that results of strep culture are pending and patient will be informed if positive result and abx will be called in at that time. Discharge with symptomatic tx. No evidence of dehydration. Pt is tolerating secretions. Presentation not concerning for peritonsillar abscess or spread of infection to deep spaces of the throat; patent airway. Specific return precautions discussed. Recommended PCP follow up. Pt appears safe for discharge.    Final Clinical Impressions(s) / ED Diagnoses   Final diagnoses:  Viral pharyngitis    New Prescriptions Discharge Medication List as of 02/25/2016  7:31 PM    I personally performed the services described in this documentation, which was scribed in my presence. The recorded information has been reviewed and is accurate.    Felicie Mornavid Shaquala Broeker, NP 02/26/16 [id number]    Raeford RazorStephen Kohut, MD 03/05/16 [phone]

## 2016-02-27 ENCOUNTER — Telehealth (HOSPITAL_BASED_OUTPATIENT_CLINIC_OR_DEPARTMENT_OTHER): Payer: Self-pay | Admitting: *Deleted

## 2016-02-27 NOTE — Telephone Encounter (Signed)
Pt called requesting results of strep culture sent on 02/25/2016. Explained to pt that the culture was still in process and she would be notified for positive results.

## 2016-02-28 DIAGNOSIS — R05 Cough: Secondary | ICD-10-CM | POA: Diagnosis not present

## 2016-02-28 DIAGNOSIS — B349 Viral infection, unspecified: Secondary | ICD-10-CM | POA: Diagnosis not present

## 2016-02-28 DIAGNOSIS — J029 Acute pharyngitis, unspecified: Secondary | ICD-10-CM | POA: Diagnosis not present

## 2016-02-28 LAB — CULTURE, GROUP A STREP (THRC)

## 2016-04-19 DIAGNOSIS — M25551 Pain in right hip: Secondary | ICD-10-CM | POA: Diagnosis not present

## 2016-05-03 DIAGNOSIS — K9041 Non-celiac gluten sensitivity: Secondary | ICD-10-CM | POA: Diagnosis not present

## 2016-05-10 DIAGNOSIS — M25551 Pain in right hip: Secondary | ICD-10-CM | POA: Diagnosis not present

## 2016-05-10 DIAGNOSIS — M545 Low back pain: Secondary | ICD-10-CM | POA: Diagnosis not present

## 2016-05-21 DIAGNOSIS — R7303 Prediabetes: Secondary | ICD-10-CM | POA: Diagnosis not present

## 2016-05-21 DIAGNOSIS — E559 Vitamin D deficiency, unspecified: Secondary | ICD-10-CM | POA: Diagnosis not present

## 2016-05-21 DIAGNOSIS — K9041 Non-celiac gluten sensitivity: Secondary | ICD-10-CM | POA: Diagnosis not present

## 2016-09-05 DIAGNOSIS — E559 Vitamin D deficiency, unspecified: Secondary | ICD-10-CM | POA: Diagnosis not present

## 2016-09-05 DIAGNOSIS — Z0000 Encounter for general adult medical examination without abnormal findings: Secondary | ICD-10-CM | POA: Diagnosis not present

## 2016-09-05 DIAGNOSIS — R7303 Prediabetes: Secondary | ICD-10-CM | POA: Diagnosis not present

## 2016-12-26 ENCOUNTER — Ambulatory Visit (INDEPENDENT_AMBULATORY_CARE_PROVIDER_SITE_OTHER): Payer: Commercial Managed Care - PPO | Admitting: Podiatry

## 2016-12-26 ENCOUNTER — Ambulatory Visit (INDEPENDENT_AMBULATORY_CARE_PROVIDER_SITE_OTHER): Payer: Commercial Managed Care - PPO

## 2016-12-26 ENCOUNTER — Encounter: Payer: Self-pay | Admitting: Podiatry

## 2016-12-26 VITALS — BP 113/88 | HR 90 | Resp 16 | Ht 61.0 in | Wt 150.0 lb

## 2016-12-26 DIAGNOSIS — M779 Enthesopathy, unspecified: Secondary | ICD-10-CM | POA: Diagnosis not present

## 2016-12-26 DIAGNOSIS — M21619 Bunion of unspecified foot: Secondary | ICD-10-CM

## 2016-12-26 DIAGNOSIS — S99921A Unspecified injury of right foot, initial encounter: Secondary | ICD-10-CM

## 2016-12-26 MED ORDER — MELOXICAM 15 MG PO TABS: 15.0000 mg | ORAL_TABLET | Freq: Every day | ORAL | 2 refills | Status: AC

## 2016-12-26 MED ORDER — TRIAMCINOLONE ACETONIDE 10 MG/ML IJ SUSP
10.0000 mg | Freq: Once | INTRAMUSCULAR | Status: AC
  Administered 2016-12-26: 10 mg

## 2016-12-26 NOTE — Progress Notes (Signed)
   Subjective:    Patient ID: Kathryn Greene, female    DOB: 02/08/1971, 46 y.o.   MRN: [id number]  HPI    Review of Systems  Musculoskeletal: Positive for arthralgias, back pain and myalgias.  Allergic/Immunologic: Positive for food allergies.  Neurological: Positive for headaches.  All other systems reviewed and are negative.      Objective:   Physical Exam        Assessment & Plan:

## 2016-12-27 NOTE — Progress Notes (Signed)
Subjective:    Patient ID: Kathryn Greene, female   DOB: 46 y.o.   MRN: [id number]   HPI patient presents stating she jammed her big toe against the door about 6 months ago and her joint has remained tender and also she has gradually developed structural bunion deformity. Patient does not smoke currently and does not drink and likes to be active    Review of Systems  All other systems reviewed and are negative.       Objective:  Physical Exam  Constitutional: She appears well-developed and well-nourished.  Cardiovascular: Intact distal pulses.  Pulmonary/Chest: Effort normal.  Musculoskeletal: Normal range of motion.  Neurological: She is alert.  Skin: Skin is warm.  Nursing note and vitals reviewed.  neurovascular status found to be intact muscle strength was adequate range of motion within normal limits with patient found to have moderate structural bunion deformity right with redness around the first metatarsal head and also inflammation with discomfort of the first MPJ right with no restriction of motion no crepitus or no loss of joint function. Patient's found have good digital perfusion and is well oriented 3     Assessment:   Traumatized right hallux first MPJ joint with possibility for structural bunion which may have occurred secondary to trauma with inflammatory capsulitis also noted      Plan:    H&P x-ray reviewed and education rendered. We'll get a try conservative first and I did go ahead and I injected around the first MPJ 3 mg Kenalog 5 mill grams Xylocaine and instructed on wider shoes with rigid bottoms and placed on oral anti-inflammatory diclofenac 75 mg twice a day. Discussed possible we may need to correct this bunion decision to be made based on response and we will see her back in 3 weeks  Trays indicate that there is structural bunion deformity right with no indications of joint damage fracture or arthritis

## 2017-01-15 DIAGNOSIS — M9903 Segmental and somatic dysfunction of lumbar region: Secondary | ICD-10-CM | POA: Diagnosis not present

## 2017-01-15 DIAGNOSIS — M9902 Segmental and somatic dysfunction of thoracic region: Secondary | ICD-10-CM | POA: Diagnosis not present

## 2017-01-15 DIAGNOSIS — M9901 Segmental and somatic dysfunction of cervical region: Secondary | ICD-10-CM | POA: Diagnosis not present

## 2017-01-17 DIAGNOSIS — M9902 Segmental and somatic dysfunction of thoracic region: Secondary | ICD-10-CM | POA: Diagnosis not present

## 2017-01-17 DIAGNOSIS — M9901 Segmental and somatic dysfunction of cervical region: Secondary | ICD-10-CM | POA: Diagnosis not present

## 2017-01-17 DIAGNOSIS — M9903 Segmental and somatic dysfunction of lumbar region: Secondary | ICD-10-CM | POA: Diagnosis not present

## 2017-01-25 ENCOUNTER — Ambulatory Visit: Payer: Commercial Managed Care - PPO | Admitting: Podiatry

## 2017-01-31 ENCOUNTER — Encounter: Payer: Self-pay | Admitting: Podiatry

## 2017-01-31 ENCOUNTER — Ambulatory Visit (INDEPENDENT_AMBULATORY_CARE_PROVIDER_SITE_OTHER): Payer: Commercial Managed Care - PPO | Admitting: Podiatry

## 2017-01-31 DIAGNOSIS — M779 Enthesopathy, unspecified: Secondary | ICD-10-CM | POA: Diagnosis not present

## 2017-01-31 DIAGNOSIS — M21619 Bunion of unspecified foot: Secondary | ICD-10-CM

## 2017-01-31 NOTE — Progress Notes (Signed)
Subjective:   Patient ID: Arlis PortaFrances Wheeling, female   DOB: 46 y.o.   MRN: [id number]   HPI Patient presents stating she still having pain in her right foot and it still makes it hard for her to wear shoe gear and she is not sure exactly where it is coming from   ROS      Objective:  Physical Exam  Neurovascular status intact with patient having moderate structural bunion deformity right with pain around the bunion site and also pain into the distal fascial band right     Assessment:  Difficult to tell if this is mostly coming from the fascial band or structural bunion or possibly both     Plan:  Discussed possibility for bunion correction which may be necessary but at this time again immobilizer for 3 weeks to take all plantar pressure off the foot and then she will gradually reintroduce shoes and we will see her back again in 4 weeks

## 2017-02-13 ENCOUNTER — Other Ambulatory Visit: Payer: Self-pay | Admitting: Podiatry

## 2017-02-13 DIAGNOSIS — M21619 Bunion of unspecified foot: Secondary | ICD-10-CM

## 2017-03-08 ENCOUNTER — Ambulatory Visit (INDEPENDENT_AMBULATORY_CARE_PROVIDER_SITE_OTHER): Payer: Commercial Managed Care - PPO | Admitting: Podiatry

## 2017-03-08 DIAGNOSIS — M21619 Bunion of unspecified foot: Secondary | ICD-10-CM | POA: Diagnosis not present

## 2017-03-08 DIAGNOSIS — M779 Enthesopathy, unspecified: Secondary | ICD-10-CM | POA: Diagnosis not present

## 2017-03-09 NOTE — Progress Notes (Signed)
Subjective:   Patient ID: Kathryn Greene, female   DOB: 47 y.o.   MRN: [id number]   HPI Patient presents stating I am still having a lot of pain around the right big toe joint right and wearing the boot only seemed to help temporarily.  States that it is red and irritated and she is tried wider shoes and she has been doing other things and it does not seem to be getting better   ROS      Objective:  Physical Exam  Neurovascular status is intact with definite inflammation around the first MPJ right that upon palpation is painful when pressed and seems to be the metamorphosis of her pain.  I did not note any plantar pain in the distal fascia at the current time     Assessment:  Strong probability that structural bunion is the problem with probable nerve irritation     Plan:  H&P condition reviewed and at this point I do think a distal osteotomy is going to be necessary for this patient.  She is going to continue to try to modify shoes and use oral anti-inflammatories and soaks and she most likely will have this done in the summer.  I gave instructions for contacting our surgical coordinator and I will see her back prior to procedure to discuss distal osteotomy.  She already has boot that she can use for the postop.  And I have encouraged her to wear this as needed up until that time

## 2017-04-08 DIAGNOSIS — M9901 Segmental and somatic dysfunction of cervical region: Secondary | ICD-10-CM | POA: Diagnosis not present

## 2017-04-08 DIAGNOSIS — M9903 Segmental and somatic dysfunction of lumbar region: Secondary | ICD-10-CM | POA: Diagnosis not present

## 2017-04-08 DIAGNOSIS — M9902 Segmental and somatic dysfunction of thoracic region: Secondary | ICD-10-CM | POA: Diagnosis not present

## 2017-09-18 ENCOUNTER — Other Ambulatory Visit: Payer: Self-pay | Admitting: Family Medicine

## 2017-09-18 DIAGNOSIS — R7303 Prediabetes: Secondary | ICD-10-CM | POA: Diagnosis not present

## 2017-09-18 DIAGNOSIS — E559 Vitamin D deficiency, unspecified: Secondary | ICD-10-CM | POA: Diagnosis not present

## 2017-09-18 DIAGNOSIS — Z13220 Encounter for screening for lipoid disorders: Secondary | ICD-10-CM | POA: Diagnosis not present

## 2017-09-18 DIAGNOSIS — Z1231 Encounter for screening mammogram for malignant neoplasm of breast: Secondary | ICD-10-CM

## 2017-09-18 DIAGNOSIS — Z0000 Encounter for general adult medical examination without abnormal findings: Secondary | ICD-10-CM | POA: Diagnosis not present

## 2017-09-18 DIAGNOSIS — K9041 Non-celiac gluten sensitivity: Secondary | ICD-10-CM | POA: Diagnosis not present

## 2017-10-28 ENCOUNTER — Ambulatory Visit
Admission: RE | Admit: 2017-10-28 | Discharge: 2017-10-28 | Disposition: A | Discharge status: Home / Self Care | Payer: Commercial Managed Care - PPO | Source: Ambulatory Visit | Attending: Family Medicine | Admitting: Family Medicine

## 2017-10-28 DIAGNOSIS — Z1231 Encounter for screening mammogram for malignant neoplasm of breast: Secondary | ICD-10-CM

## 2017-11-04 DIAGNOSIS — M9902 Segmental and somatic dysfunction of thoracic region: Secondary | ICD-10-CM | POA: Diagnosis not present

## 2017-11-04 DIAGNOSIS — M9903 Segmental and somatic dysfunction of lumbar region: Secondary | ICD-10-CM | POA: Diagnosis not present

## 2017-11-04 DIAGNOSIS — M9901 Segmental and somatic dysfunction of cervical region: Secondary | ICD-10-CM | POA: Diagnosis not present

## 2017-11-18 DIAGNOSIS — R293 Abnormal posture: Secondary | ICD-10-CM | POA: Diagnosis not present

## 2017-11-18 DIAGNOSIS — M5431 Sciatica, right side: Secondary | ICD-10-CM | POA: Diagnosis not present

## 2017-11-18 DIAGNOSIS — M2560 Stiffness of unspecified joint, not elsewhere classified: Secondary | ICD-10-CM | POA: Diagnosis not present

## 2017-11-25 DIAGNOSIS — M5431 Sciatica, right side: Secondary | ICD-10-CM | POA: Diagnosis not present

## 2017-11-25 DIAGNOSIS — R293 Abnormal posture: Secondary | ICD-10-CM | POA: Diagnosis not present

## 2017-11-25 DIAGNOSIS — M2560 Stiffness of unspecified joint, not elsewhere classified: Secondary | ICD-10-CM | POA: Diagnosis not present

## 2017-12-09 DIAGNOSIS — M2560 Stiffness of unspecified joint, not elsewhere classified: Secondary | ICD-10-CM | POA: Diagnosis not present

## 2017-12-09 DIAGNOSIS — M5431 Sciatica, right side: Secondary | ICD-10-CM | POA: Diagnosis not present

## 2017-12-09 DIAGNOSIS — R293 Abnormal posture: Secondary | ICD-10-CM | POA: Diagnosis not present

## 2017-12-19 DIAGNOSIS — R293 Abnormal posture: Secondary | ICD-10-CM | POA: Diagnosis not present

## 2017-12-19 DIAGNOSIS — M2560 Stiffness of unspecified joint, not elsewhere classified: Secondary | ICD-10-CM | POA: Diagnosis not present

## 2017-12-19 DIAGNOSIS — M5431 Sciatica, right side: Secondary | ICD-10-CM | POA: Diagnosis not present

## 2017-12-30 DIAGNOSIS — M5431 Sciatica, right side: Secondary | ICD-10-CM | POA: Diagnosis not present

## 2017-12-30 DIAGNOSIS — M2560 Stiffness of unspecified joint, not elsewhere classified: Secondary | ICD-10-CM | POA: Diagnosis not present

## 2017-12-30 DIAGNOSIS — R293 Abnormal posture: Secondary | ICD-10-CM | POA: Diagnosis not present

## 2018-01-01 DIAGNOSIS — M2560 Stiffness of unspecified joint, not elsewhere classified: Secondary | ICD-10-CM | POA: Diagnosis not present

## 2018-01-01 DIAGNOSIS — M5431 Sciatica, right side: Secondary | ICD-10-CM | POA: Diagnosis not present

## 2018-01-01 DIAGNOSIS — R293 Abnormal posture: Secondary | ICD-10-CM | POA: Diagnosis not present

## 2018-01-02 DIAGNOSIS — M2560 Stiffness of unspecified joint, not elsewhere classified: Secondary | ICD-10-CM | POA: Diagnosis not present

## 2018-01-02 DIAGNOSIS — R293 Abnormal posture: Secondary | ICD-10-CM | POA: Diagnosis not present

## 2018-01-02 DIAGNOSIS — M5431 Sciatica, right side: Secondary | ICD-10-CM | POA: Diagnosis not present

## 2018-01-06 DIAGNOSIS — M2560 Stiffness of unspecified joint, not elsewhere classified: Secondary | ICD-10-CM | POA: Diagnosis not present

## 2018-01-06 DIAGNOSIS — M5431 Sciatica, right side: Secondary | ICD-10-CM | POA: Diagnosis not present

## 2018-01-06 DIAGNOSIS — R293 Abnormal posture: Secondary | ICD-10-CM | POA: Diagnosis not present

## 2018-01-10 DIAGNOSIS — R293 Abnormal posture: Secondary | ICD-10-CM | POA: Diagnosis not present

## 2018-01-10 DIAGNOSIS — M2560 Stiffness of unspecified joint, not elsewhere classified: Secondary | ICD-10-CM | POA: Diagnosis not present

## 2018-01-10 DIAGNOSIS — M5431 Sciatica, right side: Secondary | ICD-10-CM | POA: Diagnosis not present

## 2018-01-13 DIAGNOSIS — M5431 Sciatica, right side: Secondary | ICD-10-CM | POA: Diagnosis not present

## 2018-01-13 DIAGNOSIS — R293 Abnormal posture: Secondary | ICD-10-CM | POA: Diagnosis not present

## 2018-01-13 DIAGNOSIS — M2560 Stiffness of unspecified joint, not elsewhere classified: Secondary | ICD-10-CM | POA: Diagnosis not present

## 2018-04-15 DIAGNOSIS — R6889 Other general symptoms and signs: Secondary | ICD-10-CM | POA: Diagnosis not present

## 2018-04-15 DIAGNOSIS — Z6828 Body mass index (BMI) 28.0-28.9, adult: Secondary | ICD-10-CM | POA: Diagnosis not present

## 2018-09-23 ENCOUNTER — Other Ambulatory Visit (HOSPITAL_COMMUNITY)
Admission: RE | Admit: 2018-09-23 | Discharge: 2018-09-23 | Disposition: A | Discharge status: Home / Self Care | Payer: Commercial Managed Care - PPO | Source: Ambulatory Visit | Attending: Family Medicine | Admitting: Family Medicine

## 2018-09-23 ENCOUNTER — Other Ambulatory Visit: Payer: Self-pay | Admitting: Family Medicine

## 2018-09-23 DIAGNOSIS — Z124 Encounter for screening for malignant neoplasm of cervix: Secondary | ICD-10-CM | POA: Insufficient documentation

## 2018-09-26 LAB — CYTOLOGY - PAP
Diagnosis: NEGATIVE
HPV: NOT DETECTED

## 2019-01-13 ENCOUNTER — Other Ambulatory Visit: Payer: Self-pay

## 2019-01-13 ENCOUNTER — Other Ambulatory Visit: Payer: Self-pay | Admitting: Family Medicine

## 2019-01-13 DIAGNOSIS — Z20822 Contact with and (suspected) exposure to covid-19: Secondary | ICD-10-CM

## 2019-01-13 DIAGNOSIS — Z1231 Encounter for screening mammogram for malignant neoplasm of breast: Secondary | ICD-10-CM

## 2019-01-14 ENCOUNTER — Ambulatory Visit
Admission: RE | Admit: 2019-01-14 | Discharge: 2019-01-14 | Disposition: A | Discharge status: Home / Self Care | Payer: Commercial Managed Care - PPO | Source: Ambulatory Visit | Attending: Family Medicine | Admitting: Family Medicine

## 2019-01-14 ENCOUNTER — Other Ambulatory Visit: Payer: Self-pay

## 2019-01-14 DIAGNOSIS — Z1231 Encounter for screening mammogram for malignant neoplasm of breast: Secondary | ICD-10-CM

## 2019-01-15 LAB — NOVEL CORONAVIRUS, NAA: SARS-CoV-2, NAA: NOT DETECTED

## 2019-11-18 IMAGING — MG DIGITAL SCREENING BILATERAL MAMMOGRAM WITH TOMO AND CAD
8 series · 9 of 24 positions shown · non-contrast
Comparison: Previous exam(s).

CLINICAL DATA: Screening.

EXAM:
DIGITAL SCREENING BILATERAL MAMMOGRAM WITH TOMO AND CAD

[L CC synth-2D]
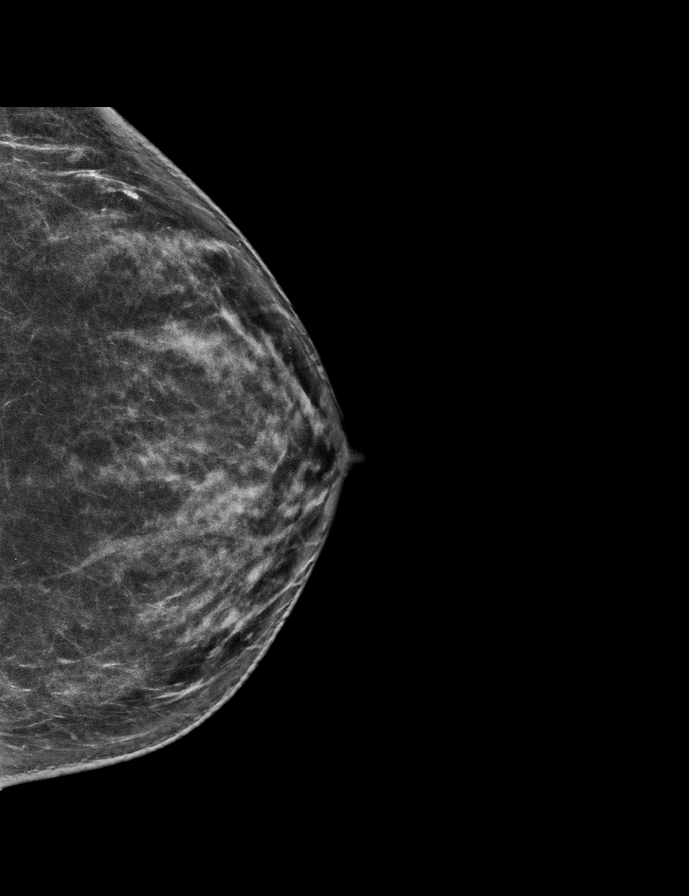

[R MLO synth-2D]
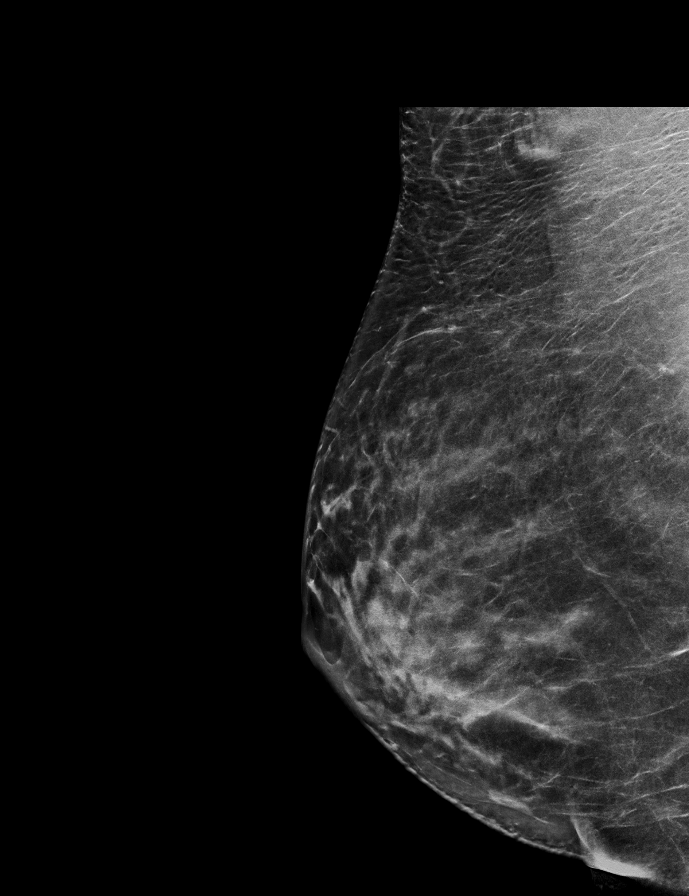

[L MLO synth-2D]
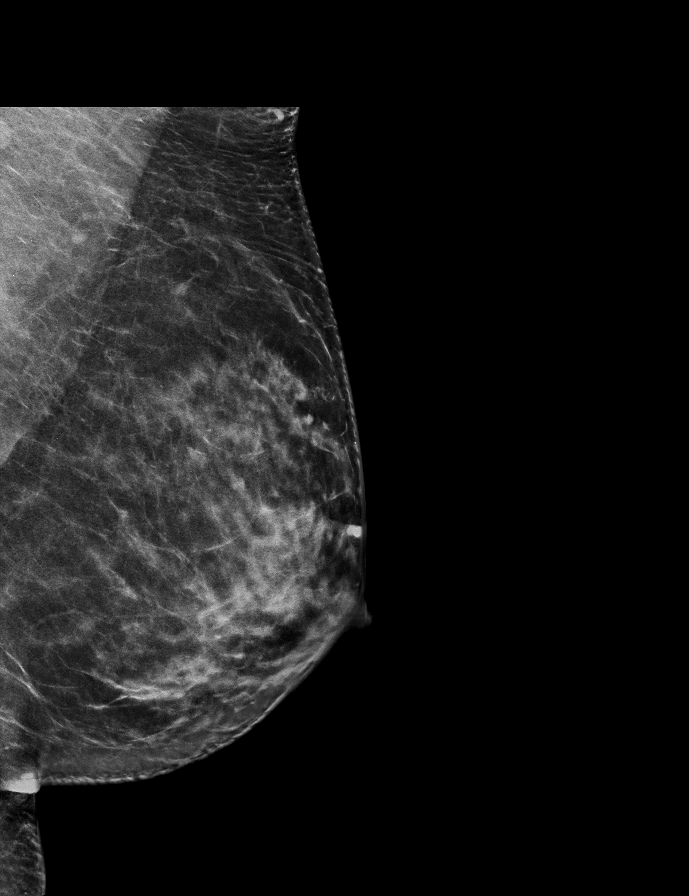

[R CC synth-2D]
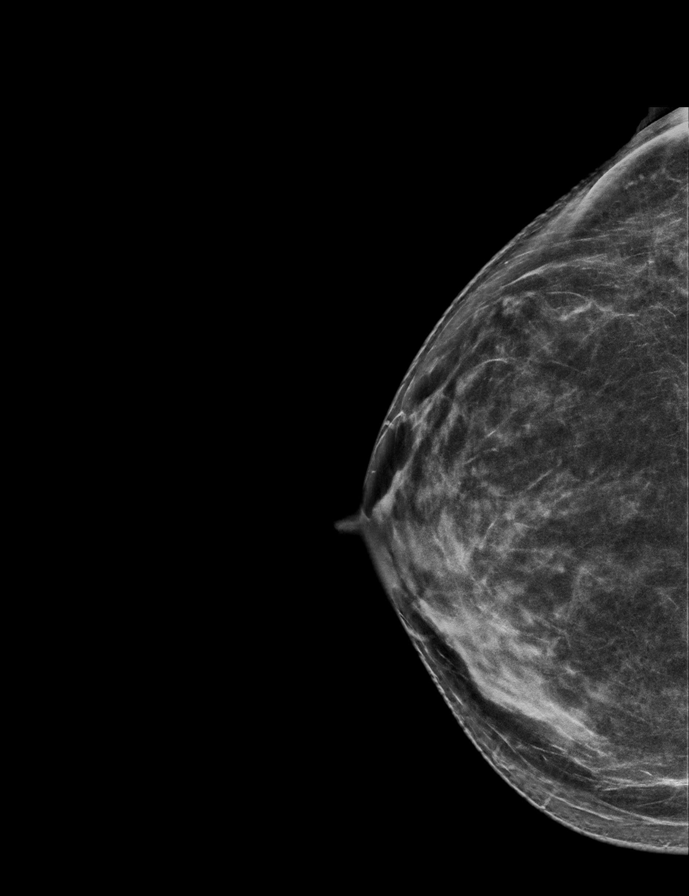

[L MLO tomo · 2 of 72 frames shown]
[frame 24/72]
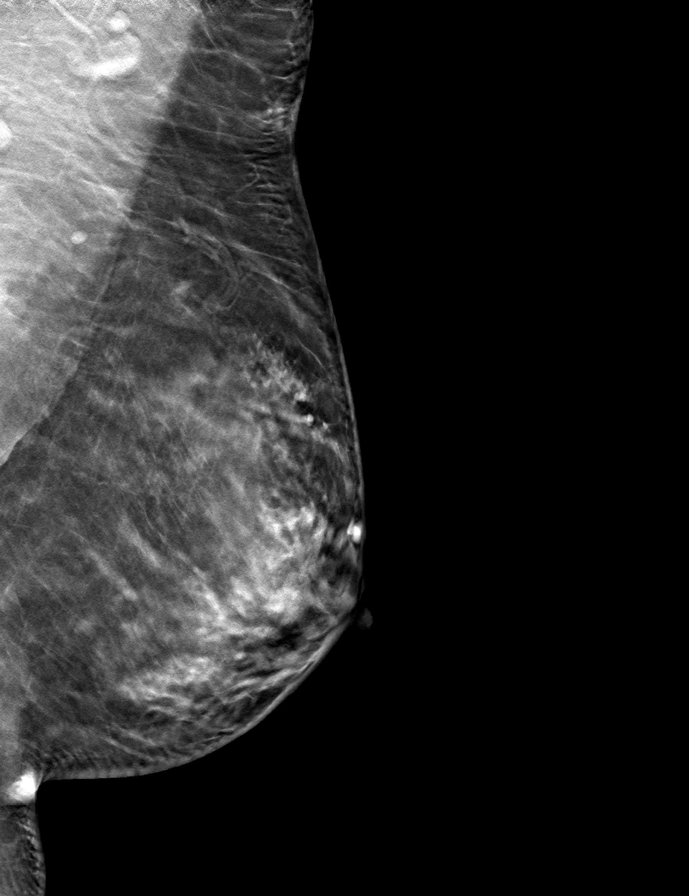
[frame 37/72]
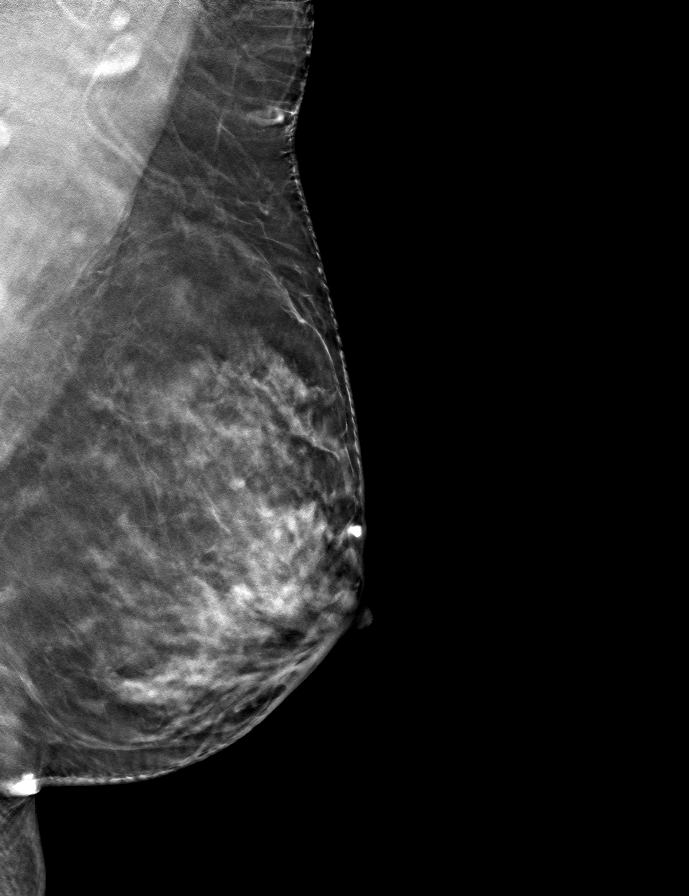

[R MLO tomo · tomo slice 39/77.0]
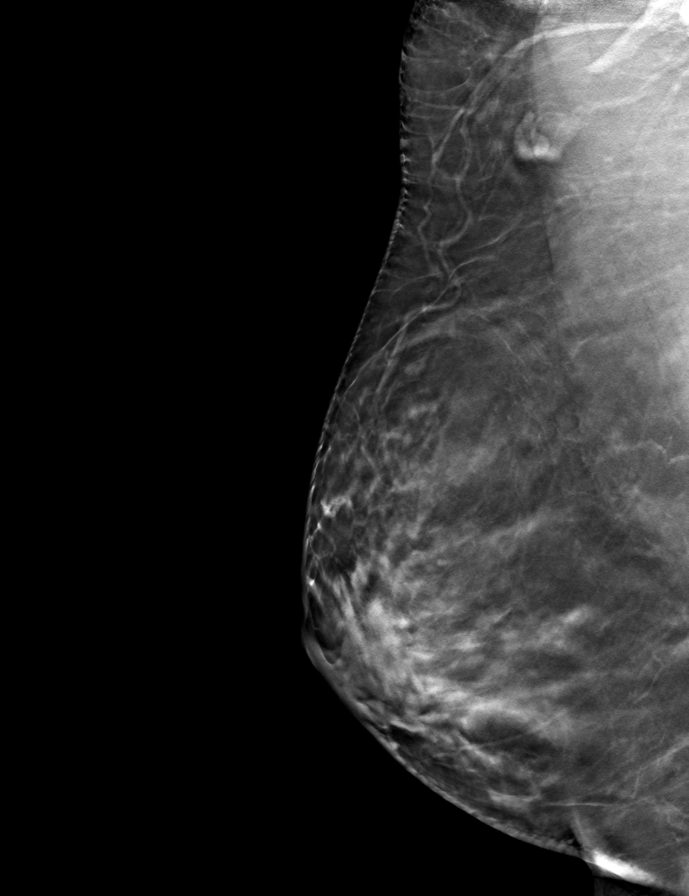

[L CC tomo · tomo slice 35/70.0]
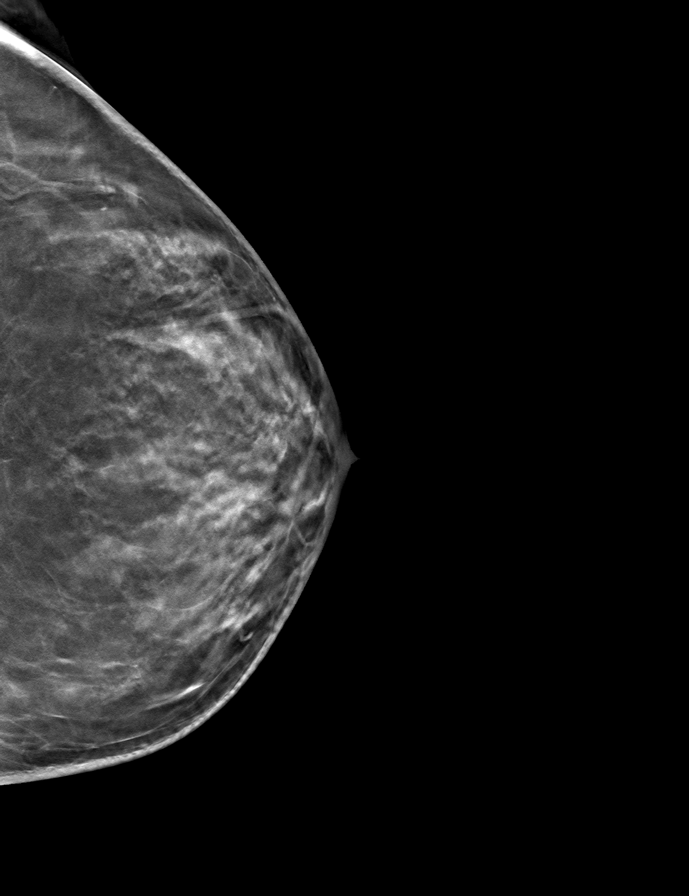

[R CC tomo · tomo slice 35/69.0]
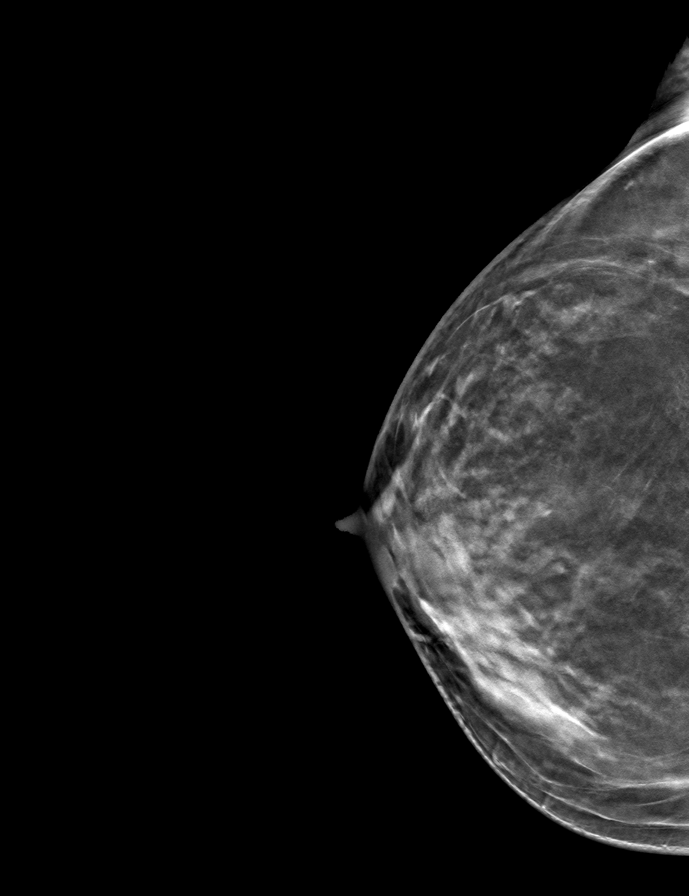

[9 of 24 positions shown; findings below may reference images not displayed]

ACR Breast Density Category c: The breast tissue is heterogeneously
dense, which may obscure small masses.
FINDINGS: There are no findings suspicious for malignancy. Images were
processed with CAD.
IMPRESSION: No mammographic evidence of malignancy. A result letter of this
screening mammogram will be mailed directly to the patient.

RECOMMENDATION:
Screening mammogram in one year. (Code:FT-U-LHB)

BI-RADS CATEGORY  1: Negative.

## 2021-10-19 ENCOUNTER — Other Ambulatory Visit: Payer: Self-pay | Admitting: Family Medicine

## 2021-10-19 DIAGNOSIS — Z1231 Encounter for screening mammogram for malignant neoplasm of breast: Secondary | ICD-10-CM

## 2021-11-23 ENCOUNTER — Ambulatory Visit
Admission: RE | Admit: 2021-11-23 | Discharge: 2021-11-23 | Disposition: A | Discharge status: Home / Self Care | Payer: Commercial Managed Care - HMO | Source: Ambulatory Visit | Attending: Family Medicine | Admitting: Family Medicine

## 2021-11-23 DIAGNOSIS — Z1231 Encounter for screening mammogram for malignant neoplasm of breast: Secondary | ICD-10-CM

## 2022-11-26 DIAGNOSIS — H00013 Hordeolum externum right eye, unspecified eyelid: Secondary | ICD-10-CM | POA: Diagnosis not present

## 2022-11-26 DIAGNOSIS — Z6827 Body mass index (BMI) 27.0-27.9, adult: Secondary | ICD-10-CM | POA: Diagnosis not present

## 2022-12-24 ENCOUNTER — Other Ambulatory Visit: Payer: Self-pay | Admitting: Family Medicine

## 2022-12-24 DIAGNOSIS — Z1231 Encounter for screening mammogram for malignant neoplasm of breast: Secondary | ICD-10-CM

## 2023-01-18 ENCOUNTER — Ambulatory Visit
Admission: RE | Admit: 2023-01-18 | Discharge: 2023-01-18 | Disposition: A | Discharge status: Home / Self Care | Payer: 59 | Source: Ambulatory Visit | Attending: Family Medicine | Admitting: Family Medicine

## 2023-01-18 DIAGNOSIS — Z1231 Encounter for screening mammogram for malignant neoplasm of breast: Secondary | ICD-10-CM

## 2023-04-19 DIAGNOSIS — Z6828 Body mass index (BMI) 28.0-28.9, adult: Secondary | ICD-10-CM | POA: Diagnosis not present

## 2023-04-19 DIAGNOSIS — R590 Localized enlarged lymph nodes: Secondary | ICD-10-CM | POA: Diagnosis not present

## 2023-04-24 ENCOUNTER — Other Ambulatory Visit: Payer: Self-pay | Admitting: Family Medicine

## 2023-04-24 DIAGNOSIS — R590 Localized enlarged lymph nodes: Secondary | ICD-10-CM

## 2023-04-25 ENCOUNTER — Ambulatory Visit
Admission: RE | Admit: 2023-04-25 | Discharge: 2023-04-25 | Disposition: A | Discharge status: Home / Self Care | Source: Ambulatory Visit | Attending: Family Medicine | Admitting: Family Medicine

## 2023-04-25 DIAGNOSIS — R590 Localized enlarged lymph nodes: Secondary | ICD-10-CM

## 2023-04-25 MED ORDER — IOPAMIDOL (ISOVUE-300) INJECTION 61%
75.0000 mL | Freq: Once | INTRAVENOUS | Status: AC | PRN
  Administered 2023-04-25: 75 mL via INTRAVENOUS

## 2023-11-04 DIAGNOSIS — Z124 Encounter for screening for malignant neoplasm of cervix: Secondary | ICD-10-CM | POA: Diagnosis not present

## 2023-11-08 ENCOUNTER — Other Ambulatory Visit (HOSPITAL_COMMUNITY): Payer: Self-pay | Admitting: Family Medicine

## 2023-11-08 DIAGNOSIS — E7800 Pure hypercholesterolemia, unspecified: Secondary | ICD-10-CM

## 2023-11-12 ENCOUNTER — Other Ambulatory Visit (HOSPITAL_BASED_OUTPATIENT_CLINIC_OR_DEPARTMENT_OTHER): Payer: Self-pay | Admitting: Family Medicine

## 2023-11-12 DIAGNOSIS — E7800 Pure hypercholesterolemia, unspecified: Secondary | ICD-10-CM

## 2023-11-13 ENCOUNTER — Telehealth (HOSPITAL_BASED_OUTPATIENT_CLINIC_OR_DEPARTMENT_OTHER): Payer: Self-pay

## 2023-11-21 ENCOUNTER — Ambulatory Visit (HOSPITAL_BASED_OUTPATIENT_CLINIC_OR_DEPARTMENT_OTHER)
Admission: RE | Admit: 2023-11-21 | Discharge: 2023-11-21 | Disposition: A | Discharge status: Home / Self Care | Payer: Self-pay | Source: Ambulatory Visit | Attending: Family Medicine | Admitting: Family Medicine

## 2023-11-21 DIAGNOSIS — E7800 Pure hypercholesterolemia, unspecified: Secondary | ICD-10-CM | POA: Insufficient documentation
